# Patient Record
Sex: Male | Born: 1935 | Race: White | Hispanic: No | Marital: Married | Smoking: Former smoker
Health system: Southern US, Community
[De-identification: ages and names within clinical notes are randomized; demographics above are authoritative.]

## PROBLEM LIST (undated history)

## (undated) DIAGNOSIS — H3530 Unspecified macular degeneration: Secondary | ICD-10-CM

## (undated) DIAGNOSIS — G40909 Epilepsy, unspecified, not intractable, without status epilepticus: Secondary | ICD-10-CM

## (undated) DIAGNOSIS — J189 Pneumonia, unspecified organism: Principal | ICD-10-CM

## (undated) DIAGNOSIS — M51369 Other intervertebral disc degeneration, lumbar region without mention of lumbar back pain or lower extremity pain: Secondary | ICD-10-CM

## (undated) DIAGNOSIS — I619 Nontraumatic intracerebral hemorrhage, unspecified: Secondary | ICD-10-CM

## (undated) DIAGNOSIS — M5136 Other intervertebral disc degeneration, lumbar region: Secondary | ICD-10-CM

## (undated) DIAGNOSIS — I10 Essential (primary) hypertension: Secondary | ICD-10-CM

## (undated) DIAGNOSIS — I4891 Unspecified atrial fibrillation: Secondary | ICD-10-CM

## (undated) DIAGNOSIS — M1990 Unspecified osteoarthritis, unspecified site: Secondary | ICD-10-CM

## (undated) DIAGNOSIS — D649 Anemia, unspecified: Secondary | ICD-10-CM

## (undated) HISTORY — PX: CORONARY ANGIOPLASTY: SHX604

## (undated) HISTORY — PX: TONSILLECTOMY: SUR1361

## (undated) HISTORY — DX: Unspecified osteoarthritis, unspecified site: M19.90

## (undated) HISTORY — PX: HERNIA REPAIR: SHX51

## (undated) HISTORY — PX: COLON SURGERY: SHX602

## (undated) HISTORY — DX: Anemia, unspecified: D64.9

## (undated) HISTORY — DX: Other intervertebral disc degeneration, lumbar region without mention of lumbar back pain or lower extremity pain: M51.369

## (undated) HISTORY — PX: APPENDECTOMY: SHX54

## (undated) HISTORY — DX: Essential (primary) hypertension: I10

## (undated) HISTORY — DX: Pneumonia, unspecified organism: J18.9

## (undated) HISTORY — PX: PACEMAKER INSERTION: SHX728

## (undated) HISTORY — DX: Unspecified atrial fibrillation: I48.91

## (undated) HISTORY — DX: Other intervertebral disc degeneration, lumbar region: M51.36

## (undated) HISTORY — DX: Nontraumatic intracerebral hemorrhage, unspecified: I61.9

## (undated) HISTORY — PX: OTHER SURGICAL HISTORY: SHX169

## (undated) HISTORY — DX: Unspecified macular degeneration: H35.30

## (undated) HISTORY — DX: Epilepsy, unspecified, not intractable, without status epilepticus: G40.909

---

## 2002-10-07 HISTORY — PX: OTHER SURGICAL HISTORY: SHX169

## 2003-12-06 ENCOUNTER — Ambulatory Visit: Payer: Self-pay | Admitting: Cardiology

## 2003-12-10 ENCOUNTER — Ambulatory Visit: Payer: Self-pay | Admitting: Family Medicine

## 2003-12-16 ENCOUNTER — Ambulatory Visit: Payer: Self-pay | Admitting: Family Medicine

## 2004-01-07 ENCOUNTER — Ambulatory Visit: Payer: Self-pay | Admitting: Family Medicine

## 2004-01-18 ENCOUNTER — Ambulatory Visit: Payer: Self-pay | Admitting: Internal Medicine

## 2004-02-04 ENCOUNTER — Ambulatory Visit: Payer: Self-pay | Admitting: Family Medicine

## 2004-02-26 ENCOUNTER — Ambulatory Visit: Payer: Self-pay | Admitting: Internal Medicine

## 2004-03-03 ENCOUNTER — Ambulatory Visit: Payer: Self-pay | Admitting: Family Medicine

## 2004-03-08 ENCOUNTER — Ambulatory Visit: Payer: Self-pay | Admitting: Family Medicine

## 2004-03-28 ENCOUNTER — Ambulatory Visit: Payer: Self-pay | Admitting: Internal Medicine

## 2004-03-31 ENCOUNTER — Ambulatory Visit: Payer: Self-pay | Admitting: Family Medicine

## 2004-04-21 ENCOUNTER — Ambulatory Visit: Payer: Self-pay | Admitting: Internal Medicine

## 2004-05-01 ENCOUNTER — Ambulatory Visit: Payer: Self-pay | Admitting: Family Medicine

## 2004-05-09 ENCOUNTER — Ambulatory Visit: Payer: Self-pay | Admitting: Family Medicine

## 2004-05-24 ENCOUNTER — Ambulatory Visit: Payer: Self-pay | Admitting: Internal Medicine

## 2004-06-01 ENCOUNTER — Ambulatory Visit: Payer: Self-pay | Admitting: Family Medicine

## 2004-06-22 ENCOUNTER — Ambulatory Visit: Payer: Self-pay | Admitting: Family Medicine

## 2004-06-29 ENCOUNTER — Ambulatory Visit: Payer: Self-pay | Admitting: Family Medicine

## 2004-07-06 ENCOUNTER — Ambulatory Visit: Payer: Self-pay | Admitting: Internal Medicine

## 2004-07-27 ENCOUNTER — Ambulatory Visit: Payer: Self-pay | Admitting: Family Medicine

## 2004-08-10 ENCOUNTER — Ambulatory Visit: Payer: Self-pay | Admitting: Internal Medicine

## 2004-08-22 ENCOUNTER — Ambulatory Visit: Payer: Self-pay | Admitting: Family Medicine

## 2004-08-25 ENCOUNTER — Ambulatory Visit: Payer: Self-pay | Admitting: Family Medicine

## 2004-08-29 ENCOUNTER — Ambulatory Visit: Payer: Self-pay | Admitting: Family Medicine

## 2004-09-21 ENCOUNTER — Ambulatory Visit: Payer: Self-pay | Admitting: Internal Medicine

## 2004-09-22 ENCOUNTER — Ambulatory Visit: Payer: Self-pay | Admitting: Family Medicine

## 2004-10-20 ENCOUNTER — Ambulatory Visit: Payer: Self-pay | Admitting: Family Medicine

## 2004-10-23 ENCOUNTER — Ambulatory Visit: Payer: Self-pay | Admitting: Family Medicine

## 2004-10-30 ENCOUNTER — Ambulatory Visit: Payer: Self-pay | Admitting: Internal Medicine

## 2004-11-14 ENCOUNTER — Ambulatory Visit: Payer: Self-pay | Admitting: Family Medicine

## 2004-11-20 ENCOUNTER — Ambulatory Visit: Payer: Self-pay | Admitting: Family Medicine

## 2004-11-28 ENCOUNTER — Ambulatory Visit: Payer: Self-pay | Admitting: Internal Medicine

## 2004-12-06 ENCOUNTER — Ambulatory Visit: Payer: Self-pay | Admitting: Family Medicine

## 2004-12-18 ENCOUNTER — Ambulatory Visit: Payer: Self-pay | Admitting: Family Medicine

## 2005-01-02 ENCOUNTER — Ambulatory Visit: Payer: Self-pay | Admitting: Internal Medicine

## 2005-01-15 ENCOUNTER — Ambulatory Visit: Payer: Self-pay | Admitting: Family Medicine

## 2005-02-02 ENCOUNTER — Ambulatory Visit: Payer: Self-pay | Admitting: Internal Medicine

## 2005-02-12 ENCOUNTER — Ambulatory Visit: Payer: Self-pay | Admitting: Family Medicine

## 2005-03-08 ENCOUNTER — Ambulatory Visit: Payer: Self-pay | Admitting: Internal Medicine

## 2005-03-12 ENCOUNTER — Ambulatory Visit: Payer: Self-pay | Admitting: Family Medicine

## 2005-04-09 ENCOUNTER — Ambulatory Visit: Payer: Self-pay | Admitting: Internal Medicine

## 2005-04-16 ENCOUNTER — Ambulatory Visit: Payer: Self-pay | Admitting: Family Medicine

## 2005-04-30 ENCOUNTER — Ambulatory Visit: Payer: Self-pay | Admitting: Family Medicine

## 2005-05-10 ENCOUNTER — Ambulatory Visit: Payer: Self-pay | Admitting: Internal Medicine

## 2005-05-28 ENCOUNTER — Ambulatory Visit: Payer: Self-pay | Admitting: Family Medicine

## 2005-06-20 ENCOUNTER — Ambulatory Visit: Payer: Self-pay | Admitting: Internal Medicine

## 2005-06-28 ENCOUNTER — Ambulatory Visit: Payer: Self-pay | Admitting: Family Medicine

## 2005-07-03 ENCOUNTER — Ambulatory Visit: Payer: Self-pay | Admitting: Family Medicine

## 2005-07-05 ENCOUNTER — Ambulatory Visit: Payer: Self-pay | Admitting: Internal Medicine

## 2005-07-12 ENCOUNTER — Ambulatory Visit: Payer: Self-pay

## 2005-07-19 ENCOUNTER — Encounter: Admission: RE | Admit: 2005-07-19 | Discharge: 2005-07-19 | Payer: Self-pay | Admitting: Cardiology

## 2005-07-26 ENCOUNTER — Inpatient Hospital Stay (HOSPITAL_BASED_OUTPATIENT_CLINIC_OR_DEPARTMENT_OTHER): Admission: RE | Admit: 2005-07-26 | Discharge: 2005-07-26 | Payer: Self-pay | Admitting: Cardiology

## 2005-08-02 ENCOUNTER — Ambulatory Visit: Payer: Self-pay | Admitting: Family Medicine

## 2005-08-07 ENCOUNTER — Ambulatory Visit: Payer: Self-pay | Admitting: Family Medicine

## 2005-08-15 ENCOUNTER — Ambulatory Visit: Payer: Self-pay | Admitting: Family Medicine

## 2005-08-29 ENCOUNTER — Ambulatory Visit: Payer: Self-pay | Admitting: Family Medicine

## 2005-09-26 ENCOUNTER — Ambulatory Visit: Payer: Self-pay | Admitting: Family Medicine

## 2005-10-30 ENCOUNTER — Ambulatory Visit: Payer: Self-pay | Admitting: Family Medicine

## 2005-11-06 ENCOUNTER — Ambulatory Visit: Payer: Self-pay | Admitting: Family Medicine

## 2005-11-07 ENCOUNTER — Ambulatory Visit: Payer: Self-pay | Admitting: Family Medicine

## 2005-11-19 ENCOUNTER — Ambulatory Visit: Payer: Self-pay | Admitting: Gastroenterology

## 2005-11-28 ENCOUNTER — Ambulatory Visit: Payer: Self-pay | Admitting: Family Medicine

## 2005-12-06 HISTORY — PX: COLONOSCOPY: SHX174

## 2005-12-25 ENCOUNTER — Ambulatory Visit: Payer: Self-pay | Admitting: Gastroenterology

## 2005-12-25 ENCOUNTER — Encounter: Payer: Self-pay | Admitting: Gastroenterology

## 2005-12-26 ENCOUNTER — Ambulatory Visit: Payer: Self-pay | Admitting: Family Medicine

## 2006-01-02 ENCOUNTER — Ambulatory Visit: Payer: Self-pay | Admitting: Family Medicine

## 2006-01-10 ENCOUNTER — Ambulatory Visit: Payer: Self-pay | Admitting: Family Medicine

## 2006-02-07 ENCOUNTER — Ambulatory Visit: Payer: Self-pay | Admitting: Family Medicine

## 2006-03-06 ENCOUNTER — Ambulatory Visit: Payer: Self-pay | Admitting: Family Medicine

## 2006-03-13 ENCOUNTER — Ambulatory Visit: Payer: Self-pay | Admitting: Family Medicine

## 2006-03-18 ENCOUNTER — Ambulatory Visit: Payer: Self-pay | Admitting: Family Medicine

## 2006-03-19 ENCOUNTER — Encounter: Payer: Self-pay | Admitting: Family Medicine

## 2006-03-20 ENCOUNTER — Ambulatory Visit: Payer: Self-pay | Admitting: Family Medicine

## 2006-04-10 ENCOUNTER — Ambulatory Visit: Payer: Self-pay | Admitting: Family Medicine

## 2006-05-06 ENCOUNTER — Ambulatory Visit: Payer: Self-pay | Admitting: Family Medicine

## 2006-05-06 LAB — CONVERTED CEMR LAB
AST: 25 units/L (ref 0–37)
Albumin: 3.8 g/dL (ref 3.5–5.2)
BUN: 12 mg/dL (ref 6–23)
Basophils Absolute: 0 10*3/uL (ref 0.0–0.1)
Basophils Relative: 0.6 % (ref 0.0–1.0)
Calcium: 9.2 mg/dL (ref 8.4–10.5)
Chloride: 106 meq/L (ref 96–112)
Cholesterol: 116 mg/dL (ref 0–200)
Creatinine, Ser: 0.8 mg/dL (ref 0.4–1.5)
GFR calc Af Amer: 120 mL/min
GFR calc non Af Amer: 99 mL/min
HDL: 46.7 mg/dL (ref >39.0)
Hemoglobin: 11.7 g/dL — ABNORMAL LOW (ref 13.0–17.0)
Hgb A1c MFr Bld: 6.1 % — ABNORMAL HIGH (ref 4.6–6.0)
LDL Cholesterol: 58 mg/dL (ref 0–99)
MCHC: 32.8 g/dL (ref 30.0–36.0)
Monocytes Relative: 11.9 % — ABNORMAL HIGH (ref 3.0–11.0)
PSA: 3.42 ng/mL (ref 0.10–4.00)
Platelets: 120 10*3/uL — ABNORMAL LOW (ref 150–400)
RBC: 4.09 M/uL — ABNORMAL LOW (ref 4.22–5.81)
RDW: 16 % — ABNORMAL HIGH (ref 11.5–14.6)
Sodium: 143 meq/L (ref 135–145)
Total CHOL/HDL Ratio: 2.5
Triglycerides: 57 mg/dL (ref 0–149)
VLDL: 11 mg/dL (ref 0–40)

## 2006-05-29 ENCOUNTER — Ambulatory Visit: Payer: Self-pay | Admitting: Family Medicine

## 2006-06-03 ENCOUNTER — Ambulatory Visit: Payer: Self-pay | Admitting: Family Medicine

## 2006-06-03 LAB — CONVERTED CEMR LAB: Prothrombin Time: 17.7 s

## 2006-06-28 ENCOUNTER — Telehealth (INDEPENDENT_AMBULATORY_CARE_PROVIDER_SITE_OTHER): Payer: Self-pay | Admitting: *Deleted

## 2006-07-02 ENCOUNTER — Ambulatory Visit: Payer: Self-pay | Admitting: Family Medicine

## 2006-07-02 LAB — CONVERTED CEMR LAB: Prothrombin Time: 21 s

## 2006-08-01 ENCOUNTER — Ambulatory Visit: Payer: Self-pay | Admitting: Family Medicine

## 2006-08-01 DIAGNOSIS — N400 Enlarged prostate without lower urinary tract symptoms: Secondary | ICD-10-CM | POA: Insufficient documentation

## 2006-08-05 LAB — CONVERTED CEMR LAB: Hgb A1c MFr Bld: 6.5 % — ABNORMAL HIGH (ref 4.6–6.0)

## 2006-08-29 ENCOUNTER — Ambulatory Visit: Payer: Self-pay | Admitting: Family Medicine

## 2006-08-29 LAB — CONVERTED CEMR LAB: INR: 2.5

## 2006-09-26 ENCOUNTER — Ambulatory Visit: Payer: Self-pay | Admitting: Family Medicine

## 2006-09-26 LAB — CONVERTED CEMR LAB: Prothrombin Time: 18.2 s

## 2006-10-22 ENCOUNTER — Ambulatory Visit: Payer: Self-pay | Admitting: Family Medicine

## 2006-10-25 ENCOUNTER — Ambulatory Visit: Payer: Self-pay | Admitting: Family Medicine

## 2006-10-25 DIAGNOSIS — D649 Anemia, unspecified: Secondary | ICD-10-CM

## 2006-10-25 DIAGNOSIS — E7800 Pure hypercholesterolemia, unspecified: Secondary | ICD-10-CM | POA: Insufficient documentation

## 2006-10-25 LAB — CONVERTED CEMR LAB: INR: 1.9

## 2006-10-29 LAB — CONVERTED CEMR LAB
ALT: 18 units/L (ref 0–53)
Hgb A1c MFr Bld: 6.3 % — ABNORMAL HIGH (ref 4.6–6.0)
LDL Cholesterol: 72 mg/dL (ref 0–99)
PSA: 3.6 ng/mL (ref 0.10–4.00)
Total CHOL/HDL Ratio: 2.8
VLDL: 9 mg/dL (ref 0–40)

## 2006-11-01 ENCOUNTER — Encounter: Admission: RE | Admit: 2006-11-01 | Discharge: 2006-11-01 | Payer: Self-pay | Admitting: Cardiology

## 2006-11-22 ENCOUNTER — Ambulatory Visit: Payer: Self-pay | Admitting: Family Medicine

## 2006-12-20 ENCOUNTER — Ambulatory Visit: Payer: Self-pay | Admitting: Family Medicine

## 2006-12-20 LAB — CONVERTED CEMR LAB: INR: 1.7

## 2007-01-03 ENCOUNTER — Ambulatory Visit: Payer: Self-pay | Admitting: Family Medicine

## 2007-01-03 LAB — CONVERTED CEMR LAB
INR: 2.8
Prothrombin Time: 20.2 s

## 2007-01-31 ENCOUNTER — Ambulatory Visit: Payer: Self-pay | Admitting: Family Medicine

## 2007-01-31 LAB — CONVERTED CEMR LAB
INR: 2.6
Prothrombin Time: 19.7 s

## 2007-02-11 ENCOUNTER — Encounter: Payer: Self-pay | Admitting: Family Medicine

## 2007-02-11 DIAGNOSIS — I10 Essential (primary) hypertension: Secondary | ICD-10-CM | POA: Insufficient documentation

## 2007-02-11 DIAGNOSIS — E119 Type 2 diabetes mellitus without complications: Secondary | ICD-10-CM

## 2007-02-11 DIAGNOSIS — M1990 Unspecified osteoarthritis, unspecified site: Secondary | ICD-10-CM | POA: Insufficient documentation

## 2007-02-11 DIAGNOSIS — I4891 Unspecified atrial fibrillation: Secondary | ICD-10-CM | POA: Insufficient documentation

## 2007-02-12 ENCOUNTER — Ambulatory Visit: Payer: Self-pay | Admitting: Family Medicine

## 2007-02-28 ENCOUNTER — Ambulatory Visit: Payer: Self-pay | Admitting: Family Medicine

## 2007-03-28 ENCOUNTER — Telehealth: Payer: Self-pay | Admitting: Family Medicine

## 2007-03-28 ENCOUNTER — Ambulatory Visit: Payer: Self-pay | Admitting: Family Medicine

## 2007-03-28 LAB — CONVERTED CEMR LAB: Blood Glucose, Fasting: 20.8 mg/dL

## 2007-04-25 ENCOUNTER — Ambulatory Visit: Payer: Self-pay | Admitting: Family Medicine

## 2007-04-25 LAB — CONVERTED CEMR LAB
INR: 2.9
Prothrombin Time: 20.6 s

## 2007-05-01 ENCOUNTER — Encounter: Payer: Self-pay | Admitting: Family Medicine

## 2007-05-09 ENCOUNTER — Telehealth: Payer: Self-pay | Admitting: Family Medicine

## 2007-05-23 ENCOUNTER — Ambulatory Visit: Payer: Self-pay | Admitting: Family Medicine

## 2007-05-26 LAB — CONVERTED CEMR LAB: Prothrombin Time: 25.3 s — ABNORMAL HIGH (ref 10.9–13.3)

## 2007-06-20 ENCOUNTER — Telehealth (INDEPENDENT_AMBULATORY_CARE_PROVIDER_SITE_OTHER): Payer: Self-pay | Admitting: *Deleted

## 2007-06-20 ENCOUNTER — Ambulatory Visit: Payer: Self-pay | Admitting: Family Medicine

## 2007-07-21 ENCOUNTER — Ambulatory Visit: Payer: Self-pay | Admitting: Family Medicine

## 2007-07-22 LAB — CONVERTED CEMR LAB: INR: 2.7 — ABNORMAL HIGH (ref 0.8–1.0)

## 2007-08-21 ENCOUNTER — Ambulatory Visit: Payer: Self-pay | Admitting: Family Medicine

## 2007-08-21 LAB — CONVERTED CEMR LAB
INR: 2.4
Prothrombin Time: 18.7 s

## 2007-09-16 ENCOUNTER — Ambulatory Visit: Payer: Self-pay | Admitting: Family Medicine

## 2007-10-01 ENCOUNTER — Ambulatory Visit: Payer: Self-pay | Admitting: Family Medicine

## 2007-10-03 ENCOUNTER — Ambulatory Visit: Payer: Self-pay | Admitting: Cardiology

## 2007-10-07 ENCOUNTER — Encounter: Payer: Self-pay | Admitting: Family Medicine

## 2007-10-07 HISTORY — PX: OTHER SURGICAL HISTORY: SHX169

## 2007-10-09 ENCOUNTER — Ambulatory Visit: Payer: Self-pay | Admitting: Specialist

## 2007-10-10 ENCOUNTER — Ambulatory Visit: Payer: Self-pay | Admitting: Family Medicine

## 2007-10-10 LAB — CONVERTED CEMR LAB
OCCULT 1: NEGATIVE
OCCULT 2: NEGATIVE
OCCULT 3: NEGATIVE

## 2007-10-15 ENCOUNTER — Ambulatory Visit: Payer: Self-pay | Admitting: Family Medicine

## 2007-10-15 LAB — CONVERTED CEMR LAB
INR: 2.6
Prothrombin Time: 19.7 s

## 2007-11-11 ENCOUNTER — Ambulatory Visit (HOSPITAL_COMMUNITY): Admission: RE | Admit: 2007-11-11 | Discharge: 2007-11-11 | Payer: Self-pay | Admitting: *Deleted

## 2007-12-10 ENCOUNTER — Ambulatory Visit: Payer: Self-pay | Admitting: Family Medicine

## 2007-12-10 LAB — CONVERTED CEMR LAB: Prothrombin Time: 13.8 s

## 2007-12-18 ENCOUNTER — Ambulatory Visit: Payer: Self-pay | Admitting: Family Medicine

## 2007-12-18 LAB — CONVERTED CEMR LAB: INR: 2.7

## 2007-12-19 LAB — CONVERTED CEMR LAB
Eosinophils Absolute: 0.1 10*3/uL (ref 0.0–0.7)
HCT: 39.5 % (ref 39.0–52.0)
Hemoglobin: 13.3 g/dL (ref 13.0–17.0)
MCHC: 33.7 g/dL (ref 30.0–36.0)
MCV: 89.4 fL (ref 78.0–100.0)
Monocytes Absolute: 0.6 10*3/uL (ref 0.1–1.0)
Monocytes Relative: 11.6 % (ref 3.0–12.0)
Neutro Abs: 2.9 10*3/uL (ref 1.4–7.7)
PSA: 3.95 ng/mL (ref 0.10–4.00)
Platelets: 98 10*3/uL — ABNORMAL LOW (ref 150–400)
RDW: 16.5 % — ABNORMAL HIGH (ref 11.5–14.6)

## 2008-01-15 ENCOUNTER — Ambulatory Visit: Payer: Self-pay | Admitting: Family Medicine

## 2008-02-12 ENCOUNTER — Ambulatory Visit: Payer: Self-pay | Admitting: Family Medicine

## 2008-02-12 LAB — CONVERTED CEMR LAB
INR: 3.5
Prothrombin Time: 22.6 s

## 2008-02-26 ENCOUNTER — Ambulatory Visit: Payer: Self-pay | Admitting: Family Medicine

## 2008-02-26 LAB — CONVERTED CEMR LAB
INR: 2.4
Prothrombin Time: 18.8 s

## 2008-03-25 ENCOUNTER — Ambulatory Visit: Payer: Self-pay | Admitting: Family Medicine

## 2008-03-25 LAB — CONVERTED CEMR LAB
INR: 2.4
Prothrombin Time: 18.8 s

## 2008-04-14 ENCOUNTER — Telehealth: Payer: Self-pay | Admitting: Family Medicine

## 2008-04-22 ENCOUNTER — Ambulatory Visit: Payer: Self-pay | Admitting: Family Medicine

## 2008-05-25 ENCOUNTER — Telehealth: Payer: Self-pay | Admitting: Family Medicine

## 2008-06-01 ENCOUNTER — Ambulatory Visit: Payer: Self-pay | Admitting: Family Medicine

## 2008-06-01 LAB — CONVERTED CEMR LAB: Prothrombin Time: 17.6 s

## 2008-06-17 ENCOUNTER — Ambulatory Visit: Payer: Self-pay | Admitting: Family Medicine

## 2008-06-17 LAB — CONVERTED CEMR LAB: INR: 2.8

## 2008-07-15 ENCOUNTER — Ambulatory Visit: Payer: Self-pay | Admitting: Family Medicine

## 2008-07-15 LAB — CONVERTED CEMR LAB
INR: 2.2
Prothrombin Time: 18.1 s

## 2008-08-12 ENCOUNTER — Ambulatory Visit: Payer: Self-pay | Admitting: Family Medicine

## 2008-08-12 LAB — CONVERTED CEMR LAB: INR: 2

## 2008-09-09 ENCOUNTER — Ambulatory Visit: Payer: Self-pay | Admitting: Family Medicine

## 2008-09-09 LAB — CONVERTED CEMR LAB: Prothrombin Time: 17.9 s

## 2008-09-14 ENCOUNTER — Telehealth: Payer: Self-pay | Admitting: Family Medicine

## 2008-09-14 ENCOUNTER — Encounter: Payer: Self-pay | Admitting: Family Medicine

## 2008-10-14 ENCOUNTER — Ambulatory Visit: Payer: Self-pay | Admitting: Family Medicine

## 2008-10-14 LAB — CONVERTED CEMR LAB: INR: 1.7

## 2008-10-21 ENCOUNTER — Ambulatory Visit: Payer: Self-pay | Admitting: Family Medicine

## 2008-10-29 ENCOUNTER — Ambulatory Visit: Payer: Self-pay | Admitting: Family Medicine

## 2008-10-29 ENCOUNTER — Encounter: Admission: RE | Admit: 2008-10-29 | Discharge: 2008-10-29 | Payer: Self-pay | Admitting: Cardiology

## 2008-10-29 LAB — CONVERTED CEMR LAB
INR: 2.2
Prothrombin Time: 18.2 s

## 2008-11-26 ENCOUNTER — Ambulatory Visit: Payer: Self-pay | Admitting: Family Medicine

## 2008-12-24 ENCOUNTER — Ambulatory Visit: Payer: Self-pay | Admitting: Family Medicine

## 2008-12-27 LAB — CONVERTED CEMR LAB: Prothrombin Time: 24.3 s — ABNORMAL HIGH (ref 9.1–11.7)

## 2009-01-21 ENCOUNTER — Ambulatory Visit: Payer: Self-pay | Admitting: Family Medicine

## 2009-01-21 LAB — CONVERTED CEMR LAB: Prothrombin Time: 16.2 s

## 2009-01-27 ENCOUNTER — Ambulatory Visit: Payer: Self-pay | Admitting: Family Medicine

## 2009-01-27 LAB — CONVERTED CEMR LAB: Prothrombin Time: 20.2 s

## 2009-02-05 DIAGNOSIS — I619 Nontraumatic intracerebral hemorrhage, unspecified: Secondary | ICD-10-CM

## 2009-02-05 HISTORY — DX: Nontraumatic intracerebral hemorrhage, unspecified: I61.9

## 2009-02-24 ENCOUNTER — Ambulatory Visit: Payer: Self-pay | Admitting: Family Medicine

## 2009-02-24 LAB — CONVERTED CEMR LAB
INR: 2.6
Prothrombin Time: 19.5 s

## 2009-03-31 ENCOUNTER — Ambulatory Visit: Payer: Self-pay | Admitting: Family Medicine

## 2009-04-04 LAB — CONVERTED CEMR LAB
Basophils Relative: 0.4 % (ref 0.0–3.0)
CO2: 33 meq/L — ABNORMAL HIGH (ref 19–32)
Calcium: 9.4 mg/dL (ref 8.4–10.5)
Chloride: 107 meq/L (ref 96–112)
Creatinine,U: 70.3 mg/dL
Eosinophils Relative: 4.2 % (ref 0.0–5.0)
HCT: 42.2 % (ref 39.0–52.0)
HDL: 46.4 mg/dL (ref >39.00)
Hemoglobin: 13.7 g/dL (ref 13.0–17.0)
Lymphs Abs: 1.6 10*3/uL (ref 0.7–4.0)
MCV: 98.5 fL (ref 78.0–100.0)
Microalb, Ur: 0.7 mg/dL (ref 0.0–1.9)
Monocytes Absolute: 0.5 10*3/uL (ref 0.1–1.0)
Potassium: 4.8 meq/L (ref 3.5–5.1)
RBC: 4.29 M/uL (ref 4.22–5.81)
Sodium: 143 meq/L (ref 135–145)
WBC: 4.9 10*3/uL (ref 4.5–10.5)

## 2009-04-28 ENCOUNTER — Ambulatory Visit: Payer: Self-pay | Admitting: Family Medicine

## 2009-04-28 LAB — CONVERTED CEMR LAB: INR: 3.1

## 2009-05-26 ENCOUNTER — Ambulatory Visit: Payer: Self-pay | Admitting: Family Medicine

## 2009-05-26 LAB — CONVERTED CEMR LAB: Prothrombin Time: 35.7 s

## 2009-06-13 ENCOUNTER — Ambulatory Visit: Payer: Self-pay | Admitting: Family Medicine

## 2009-06-13 DIAGNOSIS — D696 Thrombocytopenia, unspecified: Secondary | ICD-10-CM

## 2009-06-14 LAB — CONVERTED CEMR LAB
Basophils Absolute: 0 10*3/uL (ref 0.0–0.1)
Eosinophils Absolute: 0.2 10*3/uL (ref 0.0–0.7)
Hemoglobin: 13 g/dL (ref 13.0–17.0)
Lymphocytes Relative: 29.2 % (ref 12.0–46.0)
MCHC: 34.1 g/dL (ref 30.0–36.0)
MCV: 95 fL (ref 78.0–100.0)
Monocytes Absolute: 0.8 10*3/uL (ref 0.1–1.0)
Neutro Abs: 3.2 10*3/uL (ref 1.4–7.7)
RDW: 14 % (ref 11.5–14.6)

## 2009-06-23 ENCOUNTER — Ambulatory Visit: Payer: Self-pay | Admitting: Family Medicine

## 2009-06-23 LAB — CONVERTED CEMR LAB
INR: 2.5
Prothrombin Time: 30.3 s

## 2009-07-20 ENCOUNTER — Ambulatory Visit: Payer: Self-pay | Admitting: Family Medicine

## 2009-07-20 LAB — CONVERTED CEMR LAB
INR: 2.3
Prothrombin Time: 28.1 s

## 2009-08-17 ENCOUNTER — Telehealth: Payer: Self-pay | Admitting: Family Medicine

## 2009-08-17 ENCOUNTER — Ambulatory Visit: Payer: Self-pay | Admitting: Family Medicine

## 2009-08-17 LAB — CONVERTED CEMR LAB
INR: 2.2
Prothrombin Time: 26.4 s

## 2009-08-26 ENCOUNTER — Ambulatory Visit: Payer: Self-pay | Admitting: Family Medicine

## 2009-08-27 ENCOUNTER — Inpatient Hospital Stay (HOSPITAL_COMMUNITY): Admission: EM | Admit: 2009-08-27 | Discharge: 2009-08-31 | Payer: Self-pay | Admitting: Pediatrics

## 2009-08-27 ENCOUNTER — Emergency Department: Payer: Self-pay | Admitting: Emergency Medicine

## 2009-08-29 ENCOUNTER — Encounter (INDEPENDENT_AMBULATORY_CARE_PROVIDER_SITE_OTHER): Payer: Self-pay | Admitting: Cardiology

## 2009-08-29 ENCOUNTER — Encounter: Payer: Self-pay | Admitting: Family Medicine

## 2009-08-29 ENCOUNTER — Telehealth: Payer: Self-pay | Admitting: Family Medicine

## 2009-08-31 ENCOUNTER — Encounter: Payer: Self-pay | Admitting: Family Medicine

## 2009-09-05 ENCOUNTER — Encounter: Payer: Self-pay | Admitting: Nurse Practitioner

## 2009-09-09 ENCOUNTER — Emergency Department (HOSPITAL_COMMUNITY): Admission: EM | Admit: 2009-09-09 | Discharge: 2009-09-09 | Payer: Self-pay | Admitting: Emergency Medicine

## 2009-09-09 ENCOUNTER — Telehealth: Payer: Self-pay | Admitting: Family Medicine

## 2009-09-16 ENCOUNTER — Encounter: Payer: Self-pay | Admitting: Family Medicine

## 2009-09-20 ENCOUNTER — Telehealth: Payer: Self-pay | Admitting: Family Medicine

## 2009-10-06 HISTORY — PX: BRAIN SURGERY: SHX531

## 2009-10-11 ENCOUNTER — Ambulatory Visit: Payer: Self-pay | Admitting: Family Medicine

## 2009-10-11 DIAGNOSIS — I619 Nontraumatic intracerebral hemorrhage, unspecified: Secondary | ICD-10-CM

## 2009-11-02 ENCOUNTER — Encounter: Payer: Self-pay | Admitting: Family Medicine

## 2009-11-08 ENCOUNTER — Encounter: Payer: Self-pay | Admitting: Family Medicine

## 2009-11-11 ENCOUNTER — Encounter: Payer: Self-pay | Admitting: Family Medicine

## 2009-11-11 ENCOUNTER — Encounter: Admission: RE | Admit: 2009-11-11 | Discharge: 2009-11-11 | Payer: Self-pay | Admitting: Neurology

## 2009-11-15 ENCOUNTER — Encounter: Payer: Self-pay | Admitting: Family Medicine

## 2009-11-21 ENCOUNTER — Telehealth: Payer: Self-pay | Admitting: Family Medicine

## 2010-01-10 ENCOUNTER — Ambulatory Visit: Payer: Self-pay | Admitting: Family Medicine

## 2010-01-10 LAB — CONVERTED CEMR LAB
AST: 24 units/L (ref 0–37)
CO2: 32 meq/L (ref 19–32)
Cholesterol: 123 mg/dL (ref 0–200)
Creatinine, Ser: 0.9 mg/dL (ref 0.4–1.5)
GFR calc non Af Amer: 85.5 mL/min (ref >60.00)
Glucose, Bld: 128 mg/dL — ABNORMAL HIGH (ref 70–99)
Hgb A1c MFr Bld: 6.9 % — ABNORMAL HIGH (ref 4.6–6.5)
LDL Cholesterol: 69 mg/dL (ref 0–99)
Sodium: 143 meq/L (ref 135–145)
Total CHOL/HDL Ratio: 3

## 2010-01-12 ENCOUNTER — Ambulatory Visit: Payer: Self-pay | Admitting: Family Medicine

## 2010-01-12 LAB — HM DIABETES FOOT EXAM

## 2010-01-24 ENCOUNTER — Encounter: Payer: Self-pay | Admitting: Family Medicine

## 2010-02-26 ENCOUNTER — Encounter: Payer: Self-pay | Admitting: Family Medicine

## 2010-03-05 LAB — CONVERTED CEMR LAB
ALT: 24 units/L (ref 0–53)
AST: 26 units/L (ref 0–37)
Alkaline Phosphatase: 97 units/L (ref 39–117)
BUN: 15 mg/dL (ref 6–23)
BUN: 17 mg/dL (ref 6–23)
Basophils Relative: 0.7 % (ref 0.0–3.0)
Blood Glucose, Fasting: 0.4 mg/dL
CO2: 30 meq/L (ref 19–32)
Calcium: 9.3 mg/dL (ref 8.4–10.5)
Calcium: 9.6 mg/dL (ref 8.4–10.5)
Chloride: 105 meq/L (ref 96–112)
Creatinine, Ser: 0.8 mg/dL (ref 0.4–1.5)
Creatinine, Ser: 0.9 mg/dL (ref 0.4–1.5)
Eosinophils Absolute: 0.1 10*3/uL (ref 0.0–0.7)
Eosinophils Absolute: 0.1 10*3/uL (ref 0.0–0.7)
Eosinophils Relative: 1.9 % (ref 0.0–5.0)
Eosinophils Relative: 2.3 % (ref 0.0–5.0)
Folate: >20 ng/mL
Glucose, Bld: 109 mg/dL — ABNORMAL HIGH (ref 70–99)
HCT: 33.4 % — ABNORMAL LOW (ref 39.0–52.0)
HCT: 39.5 % (ref 39.0–52.0)
Hgb A1c MFr Bld: 6.2 % — ABNORMAL HIGH (ref 4.6–6.0)
Lymphs Abs: 1.8 10*3/uL (ref 0.7–4.0)
MCHC: 34 g/dL (ref 30.0–36.0)
MCV: 87.4 fL (ref 78.0–100.0)
MCV: 95.1 fL (ref 78.0–100.0)
Monocytes Absolute: 0.6 10*3/uL (ref 0.1–1.0)
Monocytes Absolute: 0.6 10*3/uL (ref 0.1–1.0)
Monocytes Relative: 11.8 % (ref 3.0–12.0)
Neutrophils Relative %: 49.1 % (ref 43.0–77.0)
Phosphorus: 4.3 mg/dL (ref 2.3–4.6)
Platelets: 116 10*3/uL — ABNORMAL LOW (ref 150.0–400.0)
Prothrombin Time: 19 s
RBC: 3.82 M/uL — ABNORMAL LOW (ref 4.22–5.81)
RDW: 14.6 % (ref 11.5–14.6)
Sodium: 140 meq/L (ref 135–145)
TSH: 3.32 microintl units/mL (ref 0.35–5.50)
Total Bilirubin: 0.9 mg/dL (ref 0.3–1.2)
Total CHOL/HDL Ratio: 2.7
WBC: 5.2 10*3/uL (ref 4.5–10.5)
WBC: 5.5 10*3/uL (ref 4.5–10.5)

## 2010-03-07 NOTE — Progress Notes (Signed)
Summary: pt vomiting  Phone Note Call from Patient Call back at Home Phone [phone]   Caller: Spouse Call For: Tony Part MD Summary of Call: Pt had stroke a couple of weeks ago and wife is concerned because he vomited twice today.  Should she be doing anything about that.  No other sxs.   Initial call taken by: Lowella Petties CMA,  September 09, 2009 12:13 PM  Follow-up for Phone Call        I actually recommend sending him to ER for that -- could be a sign of brain swelling and may need an urgent scan (especially if no other GI symptoms) Follow-up by: Tony Part MD,  September 09, 2009 1:45 PM  Additional Follow-up for Phone Call Additional follow up Details #1::        Patient's wife notified as instructed by telephone. Tony Camacho said would take him to [facility] ER.Lewanda Rife LPN  September 09, 2009 1:55 PM

## 2010-03-07 NOTE — Progress Notes (Signed)
Summary: discharge summary  Phone Note Outgoing Call   Summary of Call: called to check in on Tony Camacho  Follow-up for Phone Call        spoke to his wife- he is doing incredibly well speech therapy is helping - and is almost back to normal  personality back to normal and no headaches  will f/u in about 2 weeks told her I was glad to hear it he is no longer on coumadin Follow-up by: Judith Part MD,  September 20, 2009 4:26 PM  Additional Follow-up for Phone Call Additional follow up Details #1::        Tony Camacho- could you call for his d/c summary? thanks Additional Follow-up by: Judith Part MD,  September 20, 2009 4:27 PM    Additional Follow-up for Phone Call Additional follow up Details #2::    H&P and discharge summary is on your shelf in the in box.Tony Rife LPN  September 20, 2009 4:46 PM

## 2010-03-07 NOTE — Letter (Signed)
Summary: [facility]  [facility]   Imported By: Lanelle Bal 11/09/2009 11:15:25  _____________________________________________________________________  External Attachment:    Type:   Image     Comment:   External Document

## 2010-03-07 NOTE — Progress Notes (Signed)
Summary: Referred for CT cancelled..Pt had stroke.Marland Kitchen  Phone Note Call from Patient   Summary of Call: FYI to Dr. Milinda Antis Wife called [place] CT dept. Pt had a Stroke over the weekend.  CT of head cancelled.Daine Gip  August 29, 2009 9:27 AM  Initial call taken by: Daine Gip,  August 29, 2009 9:27 AM  Follow-up for Phone Call        thanks - I am aware  spoke to her today- pt had hemorrhagic stroke - and lost speech abilities briefly on sat is in icu at [facility] being watched closely  is holding coumadin and under care of cardiol and neurol overall she thinks he is back to nl today-- but still watching him carefully I thanked her for the update Follow-up by: Judith Part MD,  August 29, 2009 9:40 AM     Appended Document: Referred for CT cancelled..Pt had stroke.Artelia Laroche- he was transferred to [facility]- can you send for hosp records when able -? - thanks  Appended Document: Referred for CT cancelled..Pt had stroke.. On your shelf in the in box is what we have so far, H&P, labs and CT.

## 2010-03-07 NOTE — Progress Notes (Signed)
Summary: regardign ct   Phone Note Call from Patient Call back at Home Phone [phone]   Caller: Spouse- Tony Camacho Call For: Tony Part MD Summary of Call: Patient had CT done on 11-11-09. Wife is asking if you could review the notes and giver her a call with the results at your convenience. Initial call taken by: Melody Comas,  November 21, 2009 3:05 PM  Follow-up for Phone Call        it looks like CT shows resolving hematoma -- that is re- assuring  she should check in with neurology (since they ordered it ) to see if any further instructions Follow-up by: Tony Part MD,  November 21, 2009 3:08 PM  Additional Follow-up for Phone Call Additional follow up Details #1::        Patient's wife notified.  Additional Follow-up by: Melody Comas,  November 21, 2009 3:41 PM

## 2010-03-07 NOTE — Letter (Signed)
Summary: Tony Camacho -Intercerebral Hemorrhage  Tony Camacho -Intercerebral Hemorrhage   Imported By: Maryln Gottron 09/23/2009 13:17:29  _____________________________________________________________________  External Attachment:    Type:   Image     Comment:   External Document

## 2010-03-07 NOTE — Assessment & Plan Note (Signed)
Summary: test memory/rbh   Vital Signs:  Patient profile:   77 year old male Height:      66.5 inches Weight:      160 pounds BMI:     25.53 Temp:     98 degrees F oral Pulse rate:   76 / minute Pulse rhythm:   regular BP sitting:   116 / 64  (left arm) Cuff size:   regular  Vitals Entered By: Lewanda Rife LPN (August 26, 2009 11:31 AM) CC: test memory   History of Present Illness: here for f/u of some memory problems and changes in behavior  wife sent a list which included loosing things/ leaving water running/ agressive drving and accidents   nothing new in how he is feeling   2 weeks ago - had a headache that lasted 5 days - in the back of his head -- was mild but persistant  then behavior and memory issues got worse  he is not noticing it  cannot do crossword puzzling  pt himself thinks he is fine  no real mood change - except more passive and less motivated   he did stop driving -- since tuesday -- came home from airport way too fast  would speed up without realizing  no more headache  his wife seemed to think he had some slurring - this is better  no nausea  wife is worried about stroke      Allergies: 1)  ! Pcn 2)  ! * Avandia 3)  ! Vioxx 4)  ! * Lisinopril 5)  ! 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  Several sibs with DM, 2 sisters with colitis (chron's disease)  Social History: Last updated: 12/18/2007 Marital Status: Married Children: 3 daughters Occupation: retired non smoker  very rarely drinks alcohol   Risk Factors: Smoking Status: quit (02/11/2007)  Review of Systems General:  Denies fatigue, fever, loss of appetite, malaise, and sleep disorder. Eyes:  Denies blurring and double vision. CV:  Denies chest pain or discomfort, palpitations, shortness of breath with exertion, and swelling of feet. Resp:  Denies cough, shortness of breath, and wheezing. GI:  Denies abdominal pain, change in bowel habits, indigestion, nausea, and vomiting. GU:  Denies dysuria, hematuria, and urinary frequency. MS:  Denies joint pain and joint swelling. Derm:  Denies poor wound healing and rash. Neuro:  Complains of difficulty with concentration, headaches, and memory loss; denies brief paralysis, disturbances in coordination, falling down, numbness, poor balance, sensation of room spinning, tingling, tremors, visual disturbances, and weakness. Psych:  Complains of irritability; denies anxiety, depression, panic attacks, sense of great danger, and suicidal thoughts/plans. Endo:  Denies cold intolerance, excessive thirst, excessive urination, and heat intolerance. Heme:  Denies abnormal bruising and bleeding.  Physical Exam  General:  Well-developed,well-nourished,in no acute distress; alert,appropriate and cooperative throughout examination Head:  normocephalic, atraumatic, and  no abnormalities observed.  no temporal or sinus tenderness  no facial droop noted  Eyes:  vision grossly intact, pupils equal, pupils round, and pupils reactive to light.  fundi grossly wnl  Ears:  R ear normal and L ear normal.   Nose:  no nasal discharge.   Mouth:  pharynx pink and moist.   Neck:  supple with full rom and no masses  or thyromegally, no JVD or carotid bruit  Chest Wall:  No deformities, masses, tenderness or gynecomastia noted. Lungs:  Normal respiratory effort, chest expands symmetrically. Lungs are clear to auscultation, no crackles or wheezes. Heart:  reg rhythm today- no M or gallops Abdomen:  Bowel sounds positive,abdomen soft and non-tender without masses, organomegaly or hernias noted. no renal bruits  Msk:  No deformity or scoliosis noted of thoracic or lumbar spine.   Pulses:  R and L carotid,radial,femoral,dorsalis pedis and posterior tibial pulses are full and equal bilaterally Extremities:  No clubbing, cyanosis, edema, or deformity noted with normal full range of motion of all joints.   Neurologic:  cranial nerves II-XII intact, strength normal in all extremities, sensation intact to light touch, gait normal, DTRs symmetrical and normal, finger-to-nose normal, heel-to-shin normal, toes down bilaterally on Babinski, and Romberg negative.   Skin:  Intact without suspicious lesions or rashes Cervical Nodes:  No lymphadenopathy noted Inguinal Nodes:  No significant adenopathy Psych:  normal affect, talkative and pleasant  when questioned -pt absolutely does not realize anything is wrong Additional Exam:  MMS exam today  good clock draw score 19.5 out of 30    Diabetes Management Exam:    Foot Exam (with socks and/or shoes not present):       Sensory-Pinprick/Light touch:          Left medial foot (L-4): normal          Left dorsal foot (L-5): normal          Left lateral foot (S-1): normal          Right medial foot (L-4): normal          Right dorsal foot (L-5): normal          Right lateral foot (S-1): normal       Sensory-Monofilament:          Left foot: normal          Right foot: normal       Inspection:          Left foot: normal          Right foot: normal       Nails:          Left foot: normal          Right foot: normal   Impression & Recommendations:  Problem # 1:   HEADACHE (ICD-784.0) recent 5 day ha now resolved - with some MS changes since check CT (pt has pacemaker) likely ref to neurol His updated medication list for this problem includes:    Atenolol 50 Mg Tabs (Atenolol) .Marland Kitchen... 1/2  tab by mouth once daily    Adult Aspirin Low Strength 81 Mg Tbdp (Aspirin) .Marland Kitchen... Take by mouth as directed  Orders: Radiology Referral (Radiology)  Problem # 2:  MEMORY LOSS (ICD-780.93) with MMS of 19.5 out of 30 today pt does not recognize problem short term memory and concentration seem affected Orders: Venipuncture (16109) TLB-B12 + Folate Pnl (60454_09811-B14/NWG) TLB-CBC Platelet - w/Differential (85025-CBCD) TLB-TSH (Thyroid Stimulating Hormone) (84443-TSH) TLB-Sedimentation  Rate (ESR) (85652-ESR) TLB-BMP (Basic Metabolic Panel-BMET) (80048-METABOL) Radiology Referral (Radiology)  Complete Medication List: 1)  Flomax 0.4 Mg Cp24 (Tamsulosin hcl) .Marland Kitchen.. 1 by mouth once daily 2)  Atenolol 50 Mg Tabs (Atenolol) .... 1/2  tab by mouth once daily 3)  Metformin Hcl 500 Mg Tabs (Metformin hcl) .... Take 1 tablet by mouth two times a day 4)  Warfarin Sodium 4 Mg Tabs (Warfarin sodium) .... Take 1 tablet by mouth once a day 5)  Ocuvite Extra Tabs (Multiple vitamins-minerals) .... As directed 6)  Glucosamine 1500 Complex Caps (Glucosamine-chondroit-vit c-mn) .... 2 every day 7)  Bactroban 2 % Oint (Mupirocin) .... Apply to affected area twice a day as needed 8)  Adult Aspirin Low Strength 81 Mg Tbdp (Aspirin) .... Take by mouth as directed 9)  Multivitamins Tabs (Multiple vitamin) .... Daily 10)  Vitamin D 1000 Unit Tabs (Cholecalciferol) .... Take two tabs by mouth daily 11)  Lipitor 10 Mg Tabs (Atorvastatin calcium) .... Take one tablet by mouth  daily 12)  Warfarin Sodium 5 Mg Tabs (Warfarin sodium) .... Take 1 tablet by mouth once a day 13)  Cvs Iron 45 Mg Tabs (Iron) .... Take 1 tablet by mouth once a day 14)  Vitamin B-1 250 Mg Tabs (Thiamine hcl) ....  Take 1 tablet by mouth once a day  Patient Instructions: 1)  we will schedule CT at check out  2)  labs today 3)  will make plan based on these results   Current Allergies (reviewed today): ! PCN ! * AVANDIA ! VIOXX ! * LISINOPRIL ! [facility]

## 2010-03-07 NOTE — Letter (Signed)
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  amyloid angiopathy with cerebral hemorrhage in 2011 cataracts  macular degeneration    neuro- Dr Pearlean Brownie   (12/18/2009 21:24) Added new observation of DMEYEEXAMNXT: 11/2010 (12/18/2009 21:24) Added new observation of DMEYEEXMRES: normal (11/15/2009 21:25) Added new observation of EYE EXAM BY: Dr Marlyne Beards (11/15/2009 21:25) Added new observation of DIAB EYE EX: normal (11/15/2009 21:25)       Past Medical History:    Atrial fibrillation  (stopped coumadin due to cerebral hem)    Diabetes mellitus, type II    Hypertension (intol to ace)    Anemia-NOS    Osteoarthritis    Aks    deg disc dz- in LS     amyloid angiopathy with cerebral hemorrhage in 2011    cataracts     macular degeneration                neuro- Dr Pearlean Brownie       Diabetes Management Exam:    Eye Exam:       Eye Exam done elsewhere          Date: 11/15/2009          Results: normal          Done by: Dr Marlyne Beards

## 2010-03-07 NOTE — Assessment & Plan Note (Signed)
Summary: RENEW MEDS   Vital Signs:  Patient profile:   77 year old male Height:      66.5 inches Weight:      163 pounds BMI:     26.01 Temp:     97.5 degrees F oral Pulse rate:   76 / minute Pulse rhythm:   regular BP sitting:   122 / 68  (left arm) Cuff size:   regular  Vitals Entered By: Lewanda Rife LPN (March 31, 2009 8:05 AM)  History of Present Illness: here to renew meds- f/u for HTN and DM and chol is taking good care of himself  feels good overall  nothing new , and no new medicines   no change in med  wt is up 6 lb this winter  diet and exercise are good as always  [facility] and at home   due for labs   DM- has been well contolled  has not tested it lately  last opthy -- was 8 months ago -- - at [facility] and no retinopathy   occ low back pain       Allergies: 1)  ! Pcn 2)  ! * Avandia 3)  ! Vioxx 4)  ! * Lisinopril  Past History:  Past Surgical History: Last updated: 10/21/2007 Left ear surgery Hernia as a child Bowel obstruction- surgery Pace maker Tonsillectomy Appendectomy Carotid screen- mild stenosis, neg AAA screen, ABI's normal (10/2002) 2D Echo- mild LVF and dilated  EF 35-45% (12/2003) PUD with bleeding, per pt (1990's) Cardiolite- abn (07/2005) Cath- CAD (08/2005) Colonoscopy-polyp, tics (12/2005) 9/09 Ct brain-- small subdural hygromas - no acute change  Family History: Last updated: 12/18/2007 Father: DM Mother: DM, stomach cancer Siblings: 5 brothers, 3 sisters.  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 Eyes:  Denies blurring and eye irritation. CV:  Denies chest pain or discomfort, lightheadness, palpitations, and shortness of breath with exertion. Resp:  Denies cough and wheezing. GI:  Denies abdominal pain, bloody stools, and change in bowel habits. GU:  Complains of nocturia; denies incontinence. MS:  Complains of low back pain; denies joint pain and cramps. Derm:  Denies itching, lesion(s), poor wound healing, and rash. Neuro:  Denies numbness and tingling. Psych:  Denies anxiety and depression. Endo:  Denies cold intolerance, excessive thirst, excessive urination, and heat intolerance. Heme:  Denies abnormal bruising and bleeding.  Physical Exam  General:  Well-developed,well-nourished,in no acute distress; alert,appropriate and cooperative throughout examination Head:  normocephalic, atraumatic, and no abnormalities observed.   Eyes:  vision grossly intact, pupils equal, pupils round, and pupils reactive to light.  no conjunctival pallor, injection or icterus  Mouth:  pharynx pink and moist.   Neck:  supple with full rom and no masses or thyromegally, no JVD or carotid bruit  Chest Wall:  No deformities, masses, tenderness or gynecomastia noted. Lungs:  Normal respiratory effort, chest expands symmetrically. Lungs are clear to auscultation, no crackles or wheezes. Heart:  reg rhythm today- no M or gallops Abdomen:  Bowel sounds positive,abdomen soft and non-tender without masses,  organomegaly or hernias noted. no renal bruits  Msk:  No deformity or scoliosis noted of thoracic or lumbar spine.   Pulses:  R and L carotid,radial,femoral,dorsalis pedis and posterior tibial pulses are full and equal bilaterally Extremities:  No clubbing, cyanosis, edema, or deformity noted with normal full range of motion of all joints.   Neurologic:  sensation intact to light touch, gait normal, and DTRs symmetrical  and normal.   Skin:  Intact without suspicious lesions or rashes some dry skin on feet / heels  Cervical Nodes:  No lymphadenopathy noted Inguinal Nodes:  No significant adenopathy Psych:  normal affect, talkative and pleasant   Diabetes Management Exam:    Foot Exam (with socks and/or shoes not present):       Sensory-Pinprick/Light touch:          Left medial foot (L-4): normal          Left dorsal foot (L-5): normal          Left lateral foot (S-1): normal          Right medial foot (L-4): normal          Right dorsal foot (L-5): normal          Right lateral foot (S-1): normal       Sensory-Monofilament:          Left foot: normal          Right foot: normal       Inspection:          Left foot: normal          Right foot: normal       Nails:          Left foot: normal          Right foot: thickened    Eye Exam:       Eye Exam done elsewhere          Date: 07/06/2008          Results: normal          Done by: [facility]   Impression & Recommendations:  Problem # 1:  HYPERTENSION (ICD-401.9) Assessment Unchanged  bp remains in good control with low dose atenolol and exercise unable to tolerate ace due to hypotension lab today f/u 6 mo  His updated medication list for this problem includes:    Atenolol 50 Mg Tabs (Atenolol) .Marland Kitchen... 1/2  tab by mouth once daily  Orders: Venipuncture (54098) TLB-Lipid Panel (80061-LIPID) TLB-Renal Function Panel (80069-RENAL) TLB-CBC Platelet - w/Differential (85025-CBCD) TLB-ALT (SGPT) (84460-ALT) TLB-AST (SGOT) (84450-SGOT) TLB-TSH (Thyroid Stimulating Hormone) (84443-TSH) TLB-A1C / Hgb A1C (Glycohemoglobin) (83036-A1C) TLB-Microalbumin/Creat Ratio, Urine (82043-MALB)  BP today: 122/68 Prior BP: 120/68 (12/18/2007)  Labs Reviewed: K+: 5.1 (09/16/2007) Creat: : 0.8 (09/16/2007)   Chol: 113 (09/16/2007)   HDL: 42.4 (09/16/2007)   LDL: 57 (09/16/2007)   TG: 68 (09/16/2007)  Problem # 2:  DIABETES MELLITUS, TYPE II  (ICD-250.00) Assessment: Unchanged has been well controlled with diet and exercise and metformin- no change enc to check glucose occasionally and stick to low sugar diet  lab today and update  fu 6 mo  opthy is up to date and foot care good  intol to ace  His updated medication list for this problem includes:    Metformin Hcl 500 Mg Tabs (Metformin hcl) .Marland Kitchen... Take 1 tablet by mouth two times a day    Adult Aspirin Low Strength 81 Mg Tbdp (Aspirin) .Marland Kitchen... Take by  mouth as directed  Orders: Venipuncture (60454) TLB-Lipid Panel (80061-LIPID) TLB-Renal Function Panel (80069-RENAL) TLB-CBC Platelet - w/Differential (85025-CBCD) TLB-ALT (SGPT) (84460-ALT) TLB-AST (SGOT) (84450-SGOT) TLB-TSH (Thyroid Stimulating Hormone) (84443-TSH) TLB-A1C / Hgb A1C (Glycohemoglobin) (83036-A1C) TLB-Microalbumin/Creat Ratio, Urine (82043-MALB)  Problem # 3:  HYPERCHOLESTEROLEMIA, PURE (ICD-272.0) Assessment: Unchanged  lipids have been very well controlled  with statin and diet  lab today and update rev low sat fat diet  f/u 6 mo  The following medications were removed from the medication list:    Lipitor 20 Mg Tabs (Atorvastatin calcium) .Marland Kitchen... 1 by mouth once daily/2 His updated medication list for this problem includes:    Lipitor 10 Mg Tabs (Atorvastatin calcium) .Marland Kitchen... Take one tablet by mouth  daily  Orders: Venipuncture (09811) TLB-Lipid Panel (80061-LIPID) TLB-Renal Function Panel (80069-RENAL) TLB-CBC Platelet - w/Differential (85025-CBCD) TLB-ALT (SGPT) (84460-ALT) TLB-AST (SGOT) (84450-SGOT) TLB-TSH (Thyroid Stimulating Hormone) (84443-TSH) TLB-A1C / Hgb A1C (Glycohemoglobin) (83036-A1C) TLB-Microalbumin/Creat Ratio, Urine (82043-MALB)  Labs Reviewed: SGOT: 26 (09/16/2007)   SGPT: 24 (09/16/2007)   HDL:42.4 (09/16/2007), 46.0 (10/25/2006)  LDL:57 (09/16/2007), 72 (10/25/2006)  Chol:113 (09/16/2007), 127 (10/25/2006)  Trig:68 (09/16/2007), 43 (10/25/2006)  Problem # 4:  ANEMIA NOS  (ICD-285.9) Assessment: Unchanged will continue to monitor mild iron def without GI source cbc today Orders: Venipuncture (91478) TLB-Lipid Panel (80061-LIPID) TLB-Renal Function Panel (80069-RENAL) TLB-CBC Platelet - w/Differential (85025-CBCD) TLB-ALT (SGPT) (84460-ALT) TLB-AST (SGOT) (84450-SGOT) TLB-TSH (Thyroid Stimulating Hormone) (84443-TSH) TLB-A1C / Hgb A1C (Glycohemoglobin) (83036-A1C) TLB-Microalbumin/Creat Ratio, Urine (82043-MALB)  Problem # 5:  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 1 by mouth once daily 2)  Atenolol 50 Mg Tabs (Atenolol) .... 1/2  tab by mouth once daily 3)  Metformin Hcl 500 Mg Tabs (Metformin hcl) .... Take 1 tablet by mouth two times a day 4)  Warfarin Sodium 4 Mg Tabs (Warfarin sodium) .... Take 1 tablet by mouth once a day 5)  Ocuvite Extra Tabs (Multiple vitamins-minerals) .... As directed 6)  Glucosamine 1500 Complex Caps (Glucosamine-chondroit-vit c-mn) .... 2 every day 7)  Bactroban 2 % Oint (Mupirocin) .... Apply to affected area twice a day as needed 8)  Adult Aspirin Low Strength 81 Mg Tbdp (Aspirin) .... Take by mouth as directed 9)  Multivitamins Tabs (Multiple vitamin) .... Daily 10)  Vitamin D 1000 Unit Tabs (Cholecalciferol) .... Daily 11)  Lipitor 10 Mg Tabs (Atorvastatin calcium) .... Take one tablet by mouth  daily  Patient Instructions: 1)  no changes in medicines  2)  keep working on diet and exercise  3)  labs today 4)  follow up with me in about 6 months  Prescriptions: LIPITOR 10 MG TABS (ATORVASTATIN CALCIUM) take one tablet by mouth  daily  #90 x 3   Entered and Authorized by:   Judith Part MD   Signed by:   Judith Part MD on 03/31/2009   Method used:   Print then Give to Patient   RxID:   [id number] METFORMIN HCL 500 MG   TABS (METFORMIN HCL) Take 1 tablet by mouth two times a day  #180 x 3   Entered and Authorized by:   Judith Part MD   Signed by:   Judith Part MD on 03/31/2009   Method used:   Print then Give to Patient   RxID:   [id number] ATENOLOL 50 MG  TABS (ATENOLOL) 1/2  tab by mouth once daily  #45 x 3   Entered and Authorized by:   Judith Part MD   Signed by:   Judith Part MD on 03/31/2009   Method used:   Print then Give to Patient   RxID:   [id number] FLOMAX 0.4 MG CP24 (TAMSULOSIN HCL) 1 by mouth once daily  #90 x 3   Entered and Authorized by:   Judith Part MD   Signed by:   Judith Part MD on 03/31/2009   Method used:   Print then Give to Patient   RxID:   [id number]   Current Allergies (reviewed today): ! PCN ! * AVANDIA ! VIOXX ! * LISINOPRIL   Influenza Immunization History:    Influenza # 1:  Fluvax 3+ (01/05/2009)    Preventive Care Screening  Contraindications of Treatment or Deferment of Test/Procedure:    Treatment: ACE-Inhibitor    Contraindication: hypotension  Appended Document: RENEW MEDS  Laboratory Results   Blood Tests   Date/Time Recieved: March 31, 2009 10:16 AM  Date/Time Reported: March 31, 2009 10:16 AM    INR: 2.6   (Normal Range: 0.88-1.12   Therap INR: 2.0-3.5)      ANTICOAGULATION RECORD PREVIOUS REGIMEN & LAB RESULTS Anticoagulation Diagnosis:  Atrial fibrillation on  02/24/2009 Previous INR Goal Range:  2.0-3.5 on  01/21/2009 Previous INR:  2.6 on  02/24/2009 Previous Coumadin Dose(mg):  5 mg daily on  02/24/2009 Previous Regimen:  5 mg daily on  02/24/2009 Previous Coagulation Comments:  . on  12/18/2007  NEW REGIMEN & LAB RESULTS Current INR: 2.6 Regimen: 5 mg daily  (no change)  Provider: Porchea Charrier      Repeat testing in: 4 weeks MEDICATIONS FLOMAX 0.4 MG CP24 (TAMSULOSIN HCL) 1 by mouth once daily ATENOLOL 50 MG  TABS (ATENOLOL) 1/2  tab by mouth once daily METFORMIN HCL 500 MG   TABS (METFORMIN HCL) Take 1 tablet by mouth two times a day WARFARIN SODIUM 4 MG  TABS (WARFARIN SODIUM) Take 1 tablet by mouth once a day OCUVITE EXTRA   TABS (MULTIPLE VITAMINS-MINERALS) as directed GLUCOSAMINE 1500 COMPLEX   CAPS (GLUCOSAMINE-CHONDROIT-VIT C-MN) 2 every day BACTROBAN 2 %  OINT (MUPIROCIN) apply to affected area twice a day as needed ADULT ASPIRIN LOW STRENGTH 81 MG  TBDP (ASPIRIN) take by mouth as directed MULTIVITAMINS   TABS (MULTIPLE VITAMIN) daily VITAMIN D 1000 UNIT  TABS (CHOLECALCIFEROL) daily LIPITOR 10 MG TABS (ATORVASTATIN CALCIUM) take one tablet by mouth  daily   Anticoagulation Visit Questionnaire      Coumadin dose missed/changed:  No      Abnormal Bleeding Symptoms:  No Any diet changes including alcohol intake, vegetables or greens since the last visit:  No Any illnesses or hospitalizations since the last visit:  No Any signs of clotting since the last visit (including chest discomfort, dizziness, shortness of breath, arm tingling, slurred speech, swelling or redness in leg):  No

## 2010-03-07 NOTE — Letter (Signed)
Summary: Note from Patient Wife with Concerns  Note from Patient Wife with Concerns   Imported By: Lanelle Bal 08/23/2009 12:33:21  _____________________________________________________________________  External Attachment:    Type:   Image     Comment:   External Document

## 2010-03-07 NOTE — Letter (Signed)
Summary: Cardiology-Dr. Viann Fish  Cardiology-Dr. Viann Fish   Imported By: Maryln Gottron 09/22/2009 15:34:56  _____________________________________________________________________  External Attachment:    Type:   Image     Comment:   External Document

## 2010-03-07 NOTE — Progress Notes (Signed)
Summary: notes from pt's wife  Phone Note Call from Patient Call back at Home Phone [phone]   Caller: Spouse Summary of Call: Pt's wife has brought in some notes regarding pt's problem with confusion.  Notes are on your shelf. Initial call taken by: Lowella Petties CMA,  August 17, 2009 11:26 AM  Follow-up for Phone Call        thanks - I read them and will address at upcoming appt - please ask her if HE knows that she dropped this off and if it is ok to review some of it with him at the visit  Follow-up by: Judith Part MD,  August 17, 2009 11:34 AM  Additional Follow-up for Phone Call Additional follow up Details #1::        Wife says pt is not aware of the notes but he is aware of wife's concerns about his issues.  She said she will go over the notes with him before he comes in for his appt, so that he will be aware.  thanks that is helpful -- MT  Additional Follow-up by: Lowella Petties CMA,  August 17, 2009 11:47 AM

## 2010-03-07 NOTE — Letter (Signed)
Summary: [facility]  [facility]   Imported By: Beau Fanny 08/29/2009 14:35:55  _____________________________________________________________________  External Attachment:    Type:   Image     Comment:   External Document

## 2010-03-07 NOTE — Assessment & Plan Note (Signed)
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 Pcn 2)  ! * Avandia 3)  ! Vioxx 4)  ! * Lisinopril 5)  ! Tonette Bihari  Past History:  Family History: Last updated: 12/18/2007 Father: DM Mother: DM, stomach cancer Siblings: 5 brothers, 3 sisters.  Several sibs with DM, 2 sisters with colitis (chron's disease)  Social History: Last updated: 12/18/2007 Marital Status: Married Children: 3 daughters Occupation: retired non smoker  very rarely drinks alcohol   Risk Factors: Smoking Status: quit (02/11/2007)  Past Medical History: Atrial fibrillation  (stopped coumadin due to cerebral hem) Diabetes mellitus, type II Hypertension (intol to ace) Anemia-NOS Osteoarthritis Aks deg disc dz- in LS  amyloid angiopathy with cerebral hemorrhage in 2011    neuro- Dr Pearlean Brownie  Past Surgical History: Left ear surgery Hernia as a child Bowel  obstruction- surgery Pace maker Tonsillectomy Appendectomy Carotid screen- mild stenosis, neg AAA screen, ABI's normal (10/2002) 2D Echo- mild LVF and dilated  EF 35-45% (12/2003) PUD with bleeding, per pt (1990's) Cardiolite- abn (07/2005) Cath- CAD (08/2005) Colonoscopy-polyp, tics (12/2005) 9/09 Ct brain-- small subdural hygromas - no acute change 9/11 cerebral hemorrhage from amyloid angiopathy  Review of Systems General:  Denies fatigue, fever, loss of appetite, malaise, and sweats. Eyes:  Denies blurring, vision loss-1 eye, and vision loss-both eyes. ENT:  Denies sinus pressure and sore throat. CV:  Denies chest pain or discomfort and palpitations. Resp:  Denies cough, shortness of breath, and wheezing. GI:  Denies abdominal pain and change in bowel habits. GU:  Denies dysuria and urinary frequency. MS:  Denies muscle aches and cramps. Derm:  Denies itching, lesion(s), and rash. Neuro:  Complains of memory loss; denies difficulty with concentration, disturbances in coordination, headaches, inability to speak, numbness, poor balance, tingling, visual disturbances, and weakness. Psych:  Denies anxiety, depression, irritability, and mental problems. Endo:  Denies cold intolerance, excessive thirst, excessive urination, and heat intolerance. Heme:  Denies abnormal bruising and bleeding.  Physical Exam  General:  Well-developed,well-nourished,in no acute distress; alert,appropriate and cooperative throughout examination very slt occ word finding difficulties  Head:  normocephalic, atraumatic, and no abnormalities  observed.  no facial droop  Eyes:  vision grossly intact, pupils equal, pupils round, and pupils reactive to light.  no nystagmus  Mouth:  pharynx pink and moist.   Neck:  supple with full rom and no masses or thyromegally, no JVD or carotid bruit  Lungs:  Normal respiratory effort, chest expands symmetrically. Lungs are clear to auscultation, no crackles or  wheezes. Heart:  reg rhythm today- no M or gallops Abdomen:  Bowel sounds positive,abdomen soft and non-tender without masses, organomegaly or hernias noted. no renal bruits  Msk:  No deformity or scoliosis noted of thoracic or lumbar spine.   Pulses:  R and L carotid,radial,femoral,dorsalis pedis and posterior tibial pulses are full and equal bilaterally Extremities:  No clubbing, cyanosis, edema, or deformity noted with normal full range of motion of all joints.   Neurologic:  cranial nerves II-XII intact, strength normal in all extremities, sensation intact to light touch, gait normal, DTRs symmetrical and normal, finger-to-nose normal, heel-to-shin normal, toes down bilaterally on Babinski, and Romberg negative.   Skin:  Intact without suspicious lesions or rashes Cervical Nodes:  No lymphadenopathy noted Psych:  normal affect, talkative and pleasant  seems back to his normal self  Diabetes Management Exam:    Foot Exam (with socks and/or shoes not present):       Sensory-Pinprick/Light touch:          Left medial foot (L-4): normal          Left dorsal foot (L-5): normal          Left lateral foot (S-1): normal          Right medial foot (L-4): normal          Right dorsal foot (L-5): normal          Right lateral foot (S-1): normal       Sensory-Monofilament:          Left foot: normal          Right foot: normal       Inspection:          Left foot: normal          Right foot: normal       Nails:          Left foot: normal          Right foot: normal   Impression & Recommendations:  Problem # 1:  CEREBRAL HEMORRHAGE (ICD-431) Assessment New from amyloid angiopathy- now totally recovered clinically after stopping all blood thinners  will see neurol upcoming disc s/s to watch for  The following medications were removed from the medication list:    Warfarin Sodium 4 Mg Tabs (Warfarin sodium) .Marland Kitchen... Take 1 tablet by mouth once a day    Adult Aspirin Low Strength 81 Mg Tbdp  (Aspirin) .Marland Kitchen... Take by mouth as directed    Warfarin Sodium 5 Mg Tabs (Warfarin sodium) .Marland Kitchen... Take 1 tablet by mouth once a day  Problem # 2:  MEMORY LOSS (ICD-780.93) Assessment: Improved this is resolved with resolution of above  Problem # 3:  HYPERTENSION (ICD-401.9) Assessment: Unchanged  His updated medication list for this problem includes:    Atenolol 50 Mg Tabs (Atenolol) .Marland Kitchen... 1/2  tab by mouth once daily  His updated medication list for this problem includes:    Atenolol 50 Mg Tabs (Atenolol) .Marland Kitchen... 1/2  tab by mouth once daily  BP today: 128/68 Prior BP: 116/64 (08/26/2009)  Labs Reviewed: K+: 4.5 (  08/26/2009) Creat: : 0.9 (08/26/2009)   Chol: 117 (03/31/2009)   HDL: 46.40 (03/31/2009)   LDL: 57 (03/31/2009)   TG: 66.0 (03/31/2009)  Problem # 4:  ATRIAL FIBRILLATION (ICD-427.31) Assessment: Unchanged no longer on anticoag- being followed by Dr Donnie Aho The following medications were removed from the medication list:    Warfarin Sodium 4 Mg Tabs (Warfarin sodium) .Marland Kitchen... Take 1 tablet by mouth once a day    Adult Aspirin Low Strength 81 Mg Tbdp (Aspirin) .Marland Kitchen... Take by mouth as directed    Warfarin Sodium 5 Mg Tabs (Warfarin sodium) .Marland Kitchen... Take 1 tablet by mouth once a day His updated medication list for this problem includes:    Atenolol 50 Mg Tabs (Atenolol) .Marland Kitchen... 1/2  tab by mouth once daily  Problem # 5:  DIABETES MELLITUS, TYPE II (ICD-250.00) Assessment: Comment Only  sugar up in hosp from stress- nl now AIC and f/u 3 mo  The following medications were removed from the medication list:    Adult Aspirin Low Strength 81 Mg Tbdp (Aspirin) .Marland Kitchen... Take by mouth as directed His updated medication list for this problem includes:    Metformin Hcl 500 Mg Tabs (Metformin hcl) .Marland Kitchen... Take 1 tablet by mouth two times a day  Labs Reviewed: Creat: 0.9 (08/26/2009)     Last Eye Exam: normal (07/06/2008) Reviewed HgBA1c results: 6.3 (03/31/2009)  6.2 (09/16/2007)  Complete  Medication List: 1)  Flomax 0.4 Mg Cp24 (Tamsulosin hcl) .Marland Kitchen.. 1 by mouth once daily 2)  Atenolol 50 Mg Tabs (Atenolol) .... 1/2  tab by mouth once daily 3)  Metformin Hcl 500 Mg Tabs (Metformin hcl) .... Take 1 tablet by mouth two times a day 4)  Ocuvite Extra Tabs (Multiple vitamins-minerals) .... As directed 5)  Glucosamine 1500 Complex Caps (Glucosamine-chondroit-vit c-mn) .... 2 every day 6)  Multivitamins Tabs (Multiple vitamin) .... Daily 7)  Vitamin D 1000 Unit Tabs (Cholecalciferol) .... Take two tabs by mouth daily 8)  Lipitor 10 Mg Tabs (Atorvastatin calcium) .... Take one tablet by mouth  daily 9)  Cvs Iron 45 Mg Tabs (Iron) .... Take 1 tablet by mouth once a day 10)  Vitamin B-1 250 Mg Tabs (Thiamine hcl) .... Take 1 tablet by mouth once a day  Patient Instructions: 1)  follow up with Dr Pearlean Brownie as planned 2)  no change in medicine  3)  schedule fasting labs and then follow up in 3 months  4)  lipids/ast/alt/ / AIC/ renal 272, 250.0  Current Allergies (reviewed today): ! PCN ! * AVANDIA ! VIOXX ! * LISINOPRIL ! [facility]

## 2010-03-09 NOTE — Assessment & Plan Note (Signed)
Summary: 3 MONTH FOLLOW UP/RBH   Vital Signs:  Patient profile:   77 year old male Height:      66.5 inches Weight:      161.75 pounds BMI:     25.81 Temp:     97.6 degrees F oral Pulse rate:   80 / minute Pulse rhythm:   regular BP sitting:   122 / 76  (left arm) Cuff size:   regular  Vitals Entered By: Lewanda Rife LPN (January 12, 2010 9:30 AM) CC: three month f/u   History of Present Illness: here for f/u of lipids and DM and HTN   is feeling good  CT showed resolving hematoma  has amyloidosis -- and symptoms after hemorrhage are pretty much resolved     wt is up 4 lb  HTN is well controlled at 122/76 no headachea  DM is a bit worse- unusual  Aic is 6.9 up from 6.3 diet- a little holiday eating -- some sweets  is still exercising more than 4 times per week  utd opthy on metformin  chol in good control with statin and diet Last Lipid ProfileCholesterol: 123 (01/10/2010 8:37:54 AM)HDL:  40.90 (01/10/2010 8:37:54 AM)LDL:  69 (01/10/2010 8:37:54 AM)Triglycerides:  Last Liver profileSGOT:  24 (01/10/2010 8:37:54 AM)SPGT:  26 (01/10/2010 8:37:54 AM)T. Bili:  0.9 (09/16/2007 1:10:00 PM)Alk Phos:  97 (09/16/2007 1:10:00 PM)   lipids trig 64 and HDL 40 and LDL 69 -- very well controlled     Allergies: 1)  ! Pcn 2)  ! * Avandia 3)  ! Vioxx 4)  ! * Lisinopril 5)  ! Tonette Bihari  Past History:  Past Medical History: Last updated: 12/18/2009 Atrial fibrillation  (stopped coumadin due to cerebral hem) Diabetes mellitus, type II Hypertension (intol to ace) Anemia-NOS Osteoarthritis Aks deg disc dz- in LS  amyloid angiopathy with cerebral hemorrhage in 2011 cataracts  macular degeneration    neuro- Dr Pearlean Brownie  Past Surgical History: Last updated: 10/11/2009 Left ear surgery Hernia as a child Bowel obstruction- surgery Pace maker Tonsillectomy Appendectomy Carotid screen- mild stenosis, neg AAA screen, ABI's normal (10/2002) 2D Echo- mild LVF and  dilated  EF 35-45% (12/2003) PUD with bleeding, per pt (1990's) Cardiolite- abn (07/2005) Cath- CAD (08/2005) Colonoscopy-polyp, tics (12/2005) 9/09 Ct brain-- small subdural hygromas - no acute change 9/11 cerebral hemorrhage from amyloid angiopathy  Family History: Last updated: 12/18/2007 Father: DM Mother: DM, stomach cancer Siblings: 5 brothers, 3 sisters.  Several sibs with DM, 2 sisters with colitis (chron's disease)  Social History: Last updated: 12/18/2007 Marital Status: Married Children: 3 daughters Occupation: retired non smoker  very rarely drinks alcohol   Risk Factors: Smoking Status: quit (02/11/2007)  Review of Systems General:  Denies fatigue, loss of appetite, and malaise. Eyes:  Denies blurring and eye irritation. CV:  Denies chest pain or discomfort, lightheadness, and palpitations. Resp:  Denies cough and shortness of breath. GI:  Denies abdominal pain, bloody stools, and change in bowel habits. GU:  Denies dysuria and urinary frequency. MS:  Denies muscle aches and cramps. Derm:  Denies itching, lesion(s), poor wound healing, and rash. Neuro:  Denies numbness and tingling. Psych:  Denies anxiety, depression, and mental problems. Endo:  Denies cold intolerance, excessive thirst, excessive urination, and heat intolerance. Heme:  Denies abnormal bruising and bleeding.  Physical Exam  General:  Well-developed,well-nourished,in no acute distress; alert,appropriate and cooperative throughout examination Head:  normocephalic, atraumatic, and no abnormalities observed.   Eyes:  vision grossly intact,  pupils equal, pupils round, and pupils reactive to light.  no conjunctival pallor, injection or icterus  Mouth:  pharynx pink and moist.   Neck:  supple with full rom and no masses or thyromegally, no JVD or carotid bruit  Chest Wall:  No deformities, masses, tenderness or gynecomastia noted. Lungs:  Normal respiratory effort, chest expands symmetrically.  Lungs are clear to auscultation, no crackles or wheezes. Heart:  reg rhythm today- no M or gallops Abdomen:  Bowel sounds positive,abdomen soft and non-tender without masses, organomegaly or hernias noted. no renal bruits  Msk:  No deformity or scoliosis noted of thoracic or lumbar spine.  no acute joint changes  Pulses:  R and L carotid,radial,femoral,dorsalis pedis and posterior tibial pulses are full and equal bilaterally Extremities:  No clubbing, cyanosis, edema, or deformity noted with normal full range of motion of all joints.   Neurologic:  sensation intact to light touch, gait normal, and DTRs symmetrical and normal.   Skin:  Intact without suspicious lesions or rashes Cervical Nodes:  No lymphadenopathy noted Inguinal Nodes:  No significant adenopathy Psych:  normal affect, talkative and pleasant   Diabetes Management Exam:    Foot Exam (with socks and/or shoes not present):       Sensory-Pinprick/Light touch:          Left medial foot (L-4): normal          Left dorsal foot (L-5): normal          Left lateral foot (S-1): normal          Right medial foot (L-4): normal          Right dorsal foot (L-5): normal          Right lateral foot (S-1): normal       Sensory-Monofilament:          Left foot: normal          Right foot: normal       Inspection:          Left foot: normal          Right foot: normal       Nails:          Left foot: normal          Right foot: normal   Impression & Recommendations:  Problem # 1:  CEREBRAL HEMORRHAGE (ICD-431) Assessment Improved doing better and better  imp on CT  off blood thinners for neurol visit soon  Problem # 2:  HYPERTENSION (ICD-401.9) Assessment: Unchanged  bp great on atenolol  no changes  rev labs plan check up in 6 mo His updated medication list for this problem includes:    Atenolol 50 Mg Tabs (Atenolol) .Marland Kitchen... 1/2  tab by mouth once daily  BP today: 122/76 Prior BP: 128/68 (10/11/2009)  Labs  Reviewed: K+: 4.4 (01/10/2010) Creat: : 0.9 (01/10/2010)   Chol: 123 (01/10/2010)   HDL: 40.90 (01/10/2010)   LDL: 69 (01/10/2010)   TG: 64.0 (01/10/2010)  Problem # 3:  DIABETES MELLITUS, TYPE II (ICD-250.00) Assessment: Deteriorated  this is slt worse with holiday eating  will stop sweets  keep up exercise opthy utd foot care good no changes f/u for check up in 6 mo His updated medication list for this problem includes:    Metformin Hcl 500 Mg Tabs (Metformin hcl) .Marland Kitchen... Take 1 tablet by mouth two times a day  Labs Reviewed: Creat: 0.9 (01/10/2010)     Last Eye Exam: normal (11/15/2009)  Reviewed HgBA1c results: 6.9 (01/10/2010)  6.3 (03/31/2009)  Problem # 4:  HYPERCHOLESTEROLEMIA, PURE (ICD-272.0) Assessment: Unchanged  pt wants to change to crestor for cost will try 10 mg -- may be able to dec to 5 dep on result lab and f/u 6 mo update if any side eff His updated medication list for this problem includes:    Crestor 10 Mg Tabs (Rosuvastatin calcium) .Marland Kitchen... 1 by mouth once daily  Labs Reviewed: SGOT: 24 (01/10/2010)   SGPT: 26 (01/10/2010)   HDL:40.90 (01/10/2010), 46.40 (03/31/2009)  LDL:69 (01/10/2010), 57 (03/31/2009)  Chol:123 (01/10/2010), 117 (03/31/2009)  Trig:64.0 (01/10/2010), 66.0 (03/31/2009)  Complete Medication List: 1)  Flomax 0.4 Mg Cp24 (Tamsulosin hcl) .Marland Kitchen.. 1 by mouth once daily 2)  Atenolol 50 Mg Tabs (Atenolol) .... 1/2  tab by mouth once daily 3)  Metformin Hcl 500 Mg Tabs (Metformin hcl) .... Take 1 tablet by mouth two times a day 4)  Ocuvite Extra Tabs (Multiple vitamins-minerals) .... As directed 5)  Glucosamine 1500 Complex Caps (Glucosamine-chondroit-vit c-mn) .... 2 every day 6)  Multivitamins Tabs (Multiple vitamin) .... Daily 7)  Vitamin D 1000 Unit Tabs (Cholecalciferol) .... Take two tabs by mouth daily 8)  Crestor 10 Mg Tabs (Rosuvastatin calcium) .Marland Kitchen.. 1 by mouth once daily 9)  Cvs Iron 45 Mg Tabs (Iron) .... Take 1 tablet by mouth once a  day 10)  Vitamin B-1 250 Mg Tabs (Thiamine hcl) .... Take 1 tablet by mouth once a day 11)  Cerefolin Nac 6-2-600 Mg Tabs (Methylfol-methylcob-acetylcyst) .... Take 1 tablet by mouth once a day 12)  Dexedrine ?mg  .... Take 1 tablet by mouth once a day 13)  Fish Oil 1000 Mg  .Marland KitchenMarland Kitchen. 1 by mouth once daily  Patient Instructions: 1)  labs are good - but cut back on the sweets  2)  no change in medicine  3)  schedule fasting labs and then f/u for 30 min check up in 6 months  4)  lipid / hepatic/renal/ AIC / cbc with diff/ tsh and psa for 250/0 and 401.1 and 272 and prostate screen Prescriptions: CRESTOR 10 MG TABS (ROSUVASTATIN CALCIUM) 1 by mouth once daily  #90 x 3   Entered and Authorized by:   Judith Part MD   Signed by:   Judith Part MD on 01/12/2010   Method used:   Print then Give to Patient   RxID:   [id number] FLOMAX 0.4 MG CP24 (TAMSULOSIN HCL) 1 by mouth once daily  #90 x 3   Entered and Authorized by:   Judith Part MD   Signed by:   Judith Part MD on 01/12/2010   Method used:   Print then Give to Patient   RxID:   [id number] METFORMIN HCL 500 MG  TABS (METFORMIN HCL) Take 1 tablet by mouth two times a day  #180 x 3   Entered and Authorized by:   Judith Part MD   Signed by:   Judith Part MD on 01/12/2010   Method used:   Print then Give to Patient   RxID:   [id number] ATENOLOL 50 MG  TABS (ATENOLOL) 1/2  tab by mouth once daily  #45 x 3   Entered and Authorized by:   Judith Part MD   Signed by:   Judith Part MD on 01/12/2010   Method used:   Print then Give to Patient   RxID:   [id number]    Orders Added: 1)  Est. Patient Level IV [20254]    Current Allergies (reviewed today): ! PCN ! * AVANDIA ! VIOXX ! * LISINOPRIL ! [facility]

## 2010-03-09 NOTE — Progress Notes (Signed)
Summary: Med & Physician List Brought by Patient  Med & Physician List Brought by Patient   Imported By: Lanelle Bal 01/19/2010 12:58:53  _____________________________________________________________________  External Attachment:    Type:   Image     Comment:   External Document

## 2010-03-09 NOTE — Letter (Signed)
Summary: [facility]  [facility]   Imported By: Lanelle Bal 02/07/2010 09:34:44  _____________________________________________________________________  External Attachment:    Type:   Image     Comment:   External Document

## 2010-04-14 ENCOUNTER — Encounter: Payer: Self-pay | Admitting: Family Medicine

## 2010-04-21 LAB — URINALYSIS, ROUTINE W REFLEX MICROSCOPIC
Bilirubin Urine: NEGATIVE
Glucose, UA: 100 mg/dL — AB
Hgb urine dipstick: NEGATIVE
Ketones, ur: 40 mg/dL — AB
Nitrite: NEGATIVE
Protein, ur: NEGATIVE mg/dL
Specific Gravity, Urine: 1.018 (ref 1.005–1.030)
Urobilinogen, UA: 1 mg/dL (ref 0.0–1.0)
pH: 7.5 (ref 5.0–8.0)

## 2010-04-21 LAB — CBC
HCT: 38.4 % — ABNORMAL LOW (ref 39.0–52.0)
Hemoglobin: 13.4 g/dL (ref 13.0–17.0)
WBC: 6.6 10*3/uL (ref 4.0–10.5)

## 2010-04-21 LAB — DIFFERENTIAL
Basophils Absolute: 0 10*3/uL (ref 0.0–0.1)
Basophils Relative: 0 % (ref 0–1)
Eosinophils Absolute: 0 10*3/uL (ref 0.0–0.7)
Monocytes Relative: 2 % — ABNORMAL LOW (ref 3–12)
Neutrophils Relative %: 84 % — ABNORMAL HIGH (ref 43–77)

## 2010-04-21 LAB — COMPREHENSIVE METABOLIC PANEL
ALT: 20 U/L (ref 0–53)
Alkaline Phosphatase: 100 U/L (ref 39–117)
CO2: 27 mEq/L (ref 19–32)
Chloride: 103 mEq/L (ref 96–112)
GFR calc non Af Amer: >60 mL/min (ref >60)
Glucose, Bld: 195 mg/dL — ABNORMAL HIGH (ref 70–99)
Potassium: 3.7 mEq/L (ref 3.5–5.1)
Sodium: 138 mEq/L (ref 135–145)
Total Bilirubin: 1.2 mg/dL (ref 0.3–1.2)
Total Protein: 7.1 g/dL (ref 6.0–8.3)

## 2010-04-22 LAB — CBC
HCT: 36 % — ABNORMAL LOW (ref 39.0–52.0)
Hemoglobin: 12.5 g/dL — ABNORMAL LOW (ref 13.0–17.0)
RBC: 3.78 MIL/uL — ABNORMAL LOW (ref 4.22–5.81)
WBC: 5.7 10*3/uL (ref 4.0–10.5)

## 2010-04-22 LAB — COMPREHENSIVE METABOLIC PANEL
ALT: 19 U/L (ref 0–53)
Alkaline Phosphatase: 70 U/L (ref 39–117)
CO2: 28 mEq/L (ref 19–32)
Chloride: 105 mEq/L (ref 96–112)
GFR calc non Af Amer: >60 mL/min (ref >60)
Glucose, Bld: 107 mg/dL — ABNORMAL HIGH (ref 70–99)
Potassium: 3.6 mEq/L (ref 3.5–5.1)
Sodium: 140 mEq/L (ref 135–145)
Total Bilirubin: 1 mg/dL (ref 0.3–1.2)
Total Protein: 6.3 g/dL (ref 6.0–8.3)

## 2010-04-22 LAB — DIFFERENTIAL
Basophils Absolute: 0 10*3/uL (ref 0.0–0.1)
Basophils Relative: 0 % (ref 0–1)
Eosinophils Absolute: 0.1 10*3/uL (ref 0.0–0.7)
Monocytes Relative: 10 % (ref 3–12)
Neutrophils Relative %: 56 % (ref 43–77)

## 2010-04-22 LAB — GLUCOSE, CAPILLARY
Glucose-Capillary: 101 mg/dL — ABNORMAL HIGH (ref 70–99)
Glucose-Capillary: 104 mg/dL — ABNORMAL HIGH (ref 70–99)
Glucose-Capillary: 111 mg/dL — ABNORMAL HIGH (ref 70–99)
Glucose-Capillary: 155 mg/dL — ABNORMAL HIGH (ref 70–99)
Glucose-Capillary: 157 mg/dL — ABNORMAL HIGH (ref 70–99)
Glucose-Capillary: 169 mg/dL — ABNORMAL HIGH (ref 70–99)

## 2010-04-22 LAB — PROTIME-INR: INR: 1.48 (ref 0.00–1.49)

## 2010-04-25 NOTE — Letter (Signed)
Summary: [facility]   [facility]   Imported By: Kassie Mends 04/19/2010 09:37:17  _____________________________________________________________________  External Attachment:    Type:   Image     Comment:   External Document

## 2010-06-20 NOTE — Op Note (Signed)
NAMEWALT, GEATHERS                 ACCOUNT NO.:  [id number]   MEDICAL RECORD NO.:  [id number]          PATIENT TYPE:  OIB   LOCATION:  2899                         FACILITY:  [facility]   PHYSICIAN:  Elmore Guise., M.D.DATE OF BIRTH:  1924/05/07   DATE OF PROCEDURE:  11/11/2007  DATE OF DISCHARGE:                               OPERATIVE REPORT   PROCEDURE:  Generator change out current generator at elective  replacement interval.   HISTORY OF PRESENT ILLNESS:  Mr. Blaustein is a very pleasant 77 year old  white male who has a history of bradytachy syndrome, now has elective  generator change out.  He is now referred for generator change out.   PROCEDURE DESCRIPTION:  The patient presented to the Cardiac Cath Lab.  After appropriate informed consent, he was prepped and draped in sterile  fashion.  Approximately 20 mL of 1% lidocaine was used for local  anesthesia.  A 2-inch incision was made over the old pacemaker site in  the right deltopectoral groove.  The pacemaker generator was then  exposed with blunt and Bovie dissection.  Hemostasis was obtained with  electrocautery.  The pacemaker was removed from the pocket.  The pocket  was copiously irrigated with kanamycin solution.  The leads were then  checked for integrity.  The ventricular lead showed R waves of 16.4 mV  with impedance of 980 ohms, threshold of 1.0 volts at 0.4 milliseconds  with a current of 4.6 mA.  The atrial lead was then checked showing  atrial fibrillation waves of 0.6 mV with impedance of 460 ohms.  The old  Medtronic Kappa generator was removed.  The new generator Medtronic  ADAPTA L ADDRL-1, serial number [id number] was then placed on the table.  The ventricular lead was placed in the appropriate portion of the header  then the atrial lead was placed.  The pacemaker was then sewn into the  pocket.  The pocket was closed in 2 continuous layers with 2-0 Vicryl  suture.  Steri-Strips were applied.  No further  bleeding was noted.  The  patient was transferred from the Cardiac Cath Lab in stable condition.   IMPRESSION:  Successful generator change out.   PLAN:  The patient should have routine post pacemaker change out  restrictions.  He was given vancomycin prior to his procedure.  He  should to keep his site clean and dry for 1 week.  He is to hold his  Coumadin for the next 48 hours.  He is to call the office if he has any  further problems.      Elmore Guise., M.D.  Electronically Signed     TWK/MEDQ  D:  11/11/2007  T:  11/11/2007  Job:  [id number]   cc:   Georga Hacking, M.D.

## 2010-06-23 NOTE — Assessment & Plan Note (Signed)
[name]                           GASTROENTEROLOGY OFFICE NOTE   NAME:Tony Camacho, Tony Camacho                        MRN:          [id number]  DATE:11/19/2005                            DOB:          03-20-1924    REASON FOR REFERRAL:  Hemoccult positive stool and constipation.   HISTORY OF PRESENT ILLNESS:  Tony Camacho is a very nice 77 year old white  male referred through the courtesy of Dr. Roxy Manns.  He relates problems  with intermittent constipation for the past year.  He has noted occasional  small amounts of dark red blood in his stool over the past month.  He was  found to have Hemoccult positive stools on home hemoccults.  He states he  had a colonoscopy performed in the [place] in [place] by Dr.  Annie Sable in 2000.  He does not recall any significant abnormalities found.  He  has had problems with heartburn and reflux in the past, but these symptoms  are now inactive.  He notes no change in stool caliber, melena, or weight  loss.  He has been maintained on Coumadin anticoagulation for about the past  7-8 years for a history of atrial fibrillation.   PAST MEDICAL HISTORY:  History of bradycardia and chronic atrial  fibrillation status post pacemaker insertion in 2001.  Chronic Coumadin  anticoagulation.  Hypertension.  Diabetes mellitus.  Status post exploratory  laparotomy in the 1980s.  Status post right inguinal hernia repair as a  child.  Status post appendectomy as a child.  Coronary artery disease status  post cardiac catheterization in June of 2007.   CURRENT MEDICATIONS:  Listed on the chart, updated, and reviewed.   MEDICATION ALLERGIES:  PENICILLIN.   SOCIAL HISTORY AND REVIEW OF SYSTEMS:  Per the hand-written form.   PHYSICAL EXAM:  In no acute distress.  Height 5 feet 9 inches.  Weight 159 pounds.  Blood pressure is 132/76, pulse  68 and regular.  HEENT:  Anicteric sclerae.  Oropharynx clear.  NECK:  Without  thyromegaly or adenopathy appreciated.  CHEST:  Clear to auscultation bilaterally.  CARDIAC:  Irregular rhythm without murmurs appreciated.  ABDOMEN:  Soft.  Nontender.  Nondistended.  Normoactive bowel sounds.  No  palpable organomegaly, masses, or hernias.  RECTAL:  Deferred to the time of colonoscopy.  EXTREMITIES:  Without cyanosis, clubbing, or edema.  NEUROLOGIC:  Alert and oriented x3.  Grossly nonfocal.   ASSESSMENT AND PLAN:  Hemoccult positive stools, history of small volume  hematochezia, and worsening constipation.  Chronic Coumadin anticoagulation.  Risks, benefits, and alternatives of colonoscopy with possible biopsy and  possible polypectomy off Coumadin anticoagulation discussed with the patient  and he consents to proceed.  This will be scheduled  electively.  Will obtain clearance from Dr. Viann Fish for a hold of  Coumadin for 5 days prior to the procedure.       Venita Lick. Russella Dar, MD, [facility]      MTS/MedQ  DD:  11/19/2005  DT:  11/19/2005  Job #:  [id number]   cc:   Marne A. Tower,  MD

## 2010-06-23 NOTE — Cardiovascular Report (Signed)
Tony Camacho, Tony Camacho                 ACCOUNT NO.:  [id number]   MEDICAL RECORD NO.:  [id number]          PATIENT TYPE:  OIB   LOCATION:  1962                         FACILITY:  [facility]   PHYSICIAN:  Georga Hacking, M.D.DATE OF BIRTH:  1924-06-27   DATE OF PROCEDURE:  07/26/2005  DATE OF DISCHARGE:                              CARDIAC CATHETERIZATION   HISTORY:  The patient is a 77 year old male with an abnormal Cardiolite  stress test, family history of heart disease and dyspnea.  He has previous  atrial fibrillation as well as a permanent pacemaker.  Previous Cardiolite  showed apical infarction and possible ischemia.  c,.   The patient tolerated the procedure well without complications.  Following  the procedure, he had good hemostasis and normal peripheral pulses noted.   HEMODYNAMIC DATA:  Aorta post contrast 134/64; LV post contrast 134/10.   ANGIOGRAPHIC DATA:  Left ventriculogram:  Performed in the 30 degree RAO  projection, the aortic valve is normal.  The mitral valve is normal.  The  left ventricle appears normal in size.  There is either dyskinesis of the  apex and hypokinesis related to the previous pacemaker or a previous  infarction.   Coronary artery:  Arises and distributes normally.  There is significant  heavy calcification involving the left main and proximal LAD as well as the  right coronary artery.   Left main coronary artery:  This is calcified with what is thought to be  mild narrowing.  Initial views showed streaming along the artery, but  subsequent views showed the left main to be widely patent.   Left anterior descending:  There is a well-developed fistula from the  proximal LAD to the pulmonary artery.  There are scattered irregularities  along the LAD, but there is no significant focal stenosis noted, especially  at the apex.  The circumflex coronary artery has calcification in the mid  portion of the vessel.  There is moderate diffuse irregularity  involving the  circumflex.   Right coronary artery:  Right coronary artery is calcified.  There is  segmental 60% narrowing in the mid portion of the vessel.  There is a 50%  stenosis involving the posterior descending artery, two posterolateral  branches arise and are free of disease.   IMPRESSION:  1.  Coronary artery disease with significant calcification in the coronary      arteries particularly involving the left coronary system as circumflex      and right coronary artery with moderately severe disease involving the      right coronary artery, diffuse disease involving the circumflex and left      anterior descending which is not critical.  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  3.  Abnormal left ventricular function with an ejection fraction of about 40-      45% with apical dyskinesis and hypokinesis.   RECOMMENDATIONS:  Continue medical therapy.      Georga Hacking, M.D.  Electronically Signed     WST/MEDQ  D:  07/26/2005  T:  07/26/2005  Job:  [id number]   cc:   Marne A. Tower, M.D. [facility]  [address] [place]  [place] [address]

## 2010-07-11 ENCOUNTER — Other Ambulatory Visit (INDEPENDENT_AMBULATORY_CARE_PROVIDER_SITE_OTHER): Payer: Medicare Other | Admitting: Family Medicine

## 2010-07-11 DIAGNOSIS — Z125 Encounter for screening for malignant neoplasm of prostate: Secondary | ICD-10-CM

## 2010-07-11 DIAGNOSIS — I10 Essential (primary) hypertension: Secondary | ICD-10-CM

## 2010-07-11 DIAGNOSIS — E7800 Pure hypercholesterolemia, unspecified: Secondary | ICD-10-CM

## 2010-07-11 DIAGNOSIS — E119 Type 2 diabetes mellitus without complications: Secondary | ICD-10-CM

## 2010-07-11 LAB — HEPATIC FUNCTION PANEL
ALT: 26 U/L (ref 0–53)
AST: 22 U/L (ref 0–37)
Bilirubin, Direct: 0.2 mg/dL (ref 0.0–0.3)
Total Protein: 6.8 g/dL (ref 6.0–8.3)

## 2010-07-11 LAB — CBC WITH DIFFERENTIAL/PLATELET
Basophils Absolute: 0 10*3/uL (ref 0.0–0.1)
Eosinophils Absolute: 0.3 10*3/uL (ref 0.0–0.7)
HCT: 41.4 % (ref 39.0–52.0)
Lymphs Abs: 2.2 10*3/uL (ref 0.7–4.0)
MCHC: 34 g/dL (ref 30.0–36.0)
MCV: 98.4 fl (ref 78.0–100.0)
Monocytes Absolute: 0.5 10*3/uL (ref 0.1–1.0)
Neutrophils Relative %: 46.2 % (ref 43.0–77.0)
Platelets: 90 10*3/uL — ABNORMAL LOW (ref 150.0–400.0)
RDW: 13.4 % (ref 11.5–14.6)
WBC: 5.7 10*3/uL (ref 4.5–10.5)

## 2010-07-11 LAB — RENAL FUNCTION PANEL
Albumin: 4.2 g/dL (ref 3.5–5.2)
BUN: 14 mg/dL (ref 6–23)
Calcium: 9.5 mg/dL (ref 8.4–10.5)
Creatinine, Ser: 0.9 mg/dL (ref 0.4–1.5)
Glucose, Bld: 121 mg/dL — ABNORMAL HIGH (ref 70–99)
Phosphorus: 4 mg/dL (ref 2.3–4.6)
Potassium: 4.4 mEq/L (ref 3.5–5.1)

## 2010-07-11 LAB — PSA: PSA: 4.04 ng/mL — ABNORMAL HIGH (ref 0.10–4.00)

## 2010-07-11 LAB — TSH: TSH: 5.36 u[IU]/mL (ref 0.35–5.50)

## 2010-07-11 LAB — LIPID PANEL
Cholesterol: 95 mg/dL (ref 0–200)
VLDL: 11.4 mg/dL (ref 0.0–40.0)

## 2010-07-15 ENCOUNTER — Encounter: Payer: Self-pay | Admitting: Family Medicine

## 2010-07-18 ENCOUNTER — Encounter: Payer: Self-pay | Admitting: Family Medicine

## 2010-07-18 ENCOUNTER — Ambulatory Visit (INDEPENDENT_AMBULATORY_CARE_PROVIDER_SITE_OTHER): Payer: Medicare Other | Admitting: Family Medicine

## 2010-07-18 VITALS — BP 120/60 | HR 84 | Temp 97.7°F | Ht 66.5 in | Wt 161.0 lb

## 2010-07-18 DIAGNOSIS — I4891 Unspecified atrial fibrillation: Secondary | ICD-10-CM

## 2010-07-18 DIAGNOSIS — E119 Type 2 diabetes mellitus without complications: Secondary | ICD-10-CM

## 2010-07-18 DIAGNOSIS — D696 Thrombocytopenia, unspecified: Secondary | ICD-10-CM

## 2010-07-18 DIAGNOSIS — E7800 Pure hypercholesterolemia, unspecified: Secondary | ICD-10-CM

## 2010-07-18 DIAGNOSIS — I10 Essential (primary) hypertension: Secondary | ICD-10-CM

## 2010-07-18 DIAGNOSIS — N400 Enlarged prostate without lower urinary tract symptoms: Secondary | ICD-10-CM

## 2010-07-18 NOTE — Patient Instructions (Signed)
We will do hematology referral at check out for low platelets  Please send for last colonoscopy report (and path of any polyps) - to see when due for next one  Sugar control and cholesterol are good  Schedule non fasting labs and then follow up in 6 months

## 2010-07-18 NOTE — Progress Notes (Signed)
Subjective:    Patient ID: Tony Camacho, male    DOB: 11-11-1924, 77 y.o.   MRN: [id number]  HPI Here for check up of chronic med problems and to rev health mt list  Feels good  No new medical problems  Wt is stable and good   DM is stable with a1c of 6.8- well controlled  No changes at home in sugars  opthy 10/11  On metformin  Active and good diet -- still exercises 5 days per week    Lipids excellent with LDL 39-- on crestor Lab Results  Component Value Date   CHOL 95 07/11/2010   CHOL 123 01/10/2010   CHOL 117 03/31/2009   Lab Results  Component Value Date   HDL 44.20 07/11/2010   HDL 40.90 01/10/2010   HDL 40.98 03/31/2009   Lab Results  Component Value Date   LDLCALC 39 07/11/2010   LDLCALC 69 01/10/2010   LDLCALC 57 03/31/2009   Lab Results  Component Value Date   TRIG 57.0 07/11/2010   TRIG 64.0 01/10/2010   TRIG 66.0 03/31/2009   Lab Results  Component Value Date   CHOLHDL 2 07/11/2010   CHOLHDL 3 01/10/2010   CHOLHDL 3 03/31/2009   No results found for this basename: LDLDIRECT     Hx of anemia and thrombocytopenia Lab Results  Component Value Date   WBC 5.7 07/11/2010   HGB 14.1 07/11/2010   HCT 41.4 07/11/2010   MCV 98.4 07/11/2010   PLT 90.0* 07/11/2010    Platelets are down again  Bleeds easy and bruises easy   Last Tdap-- was within 2 years  ptx 09 Gets annual flu shots   Zoster vacc 06  colonosc 11/07 adenomatous polyp-- ? F/u 5 y? Due in fall ?   psa 4.04 - is usually in high 3s  Does not see a urologist  No more problems with urine flow- is stable Has done well on the flomax -- nocturia usually 2 times per night  Has BPH  Patient Active Problem List  Diagnoses  . DIABETES MELLITUS, TYPE II  . HYPERCHOLESTEROLEMIA, PURE  . ANEMIA NOS  . THROMBOCYTOPENIA  . HYPERTENSION  . ATRIAL FIBRILLATION  . CEREBRAL HEMORRHAGE  . 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 OSTEOARTHRITIS   Past Medical History  Diagnosis Date  . Diabetes mellitus       type II  . Atrial fibrillation     stopped coumadin due to cerebral hem.  . Hypertension     intolerance to ace  . Anemia   . Arthritis     osteoarthritis  . Cataract   . Macular degeneration   . Disc degeneration, lumbar   . Cerebral hemorrhage 2011    amyloid angiopathy with cerebral hemorrhage   Past Surgical History  Procedure Date  . Left ear surgery   . Hernia repair     as a child  . Colon surgery     bowel obstruction surgery  . Appendectomy   . Tonsillectomy   . Coronary angioplasty     CAD//2D ECHO - mild LVF and dilated EF 35-45%(12/2003)//cardiolite abn (07/2005)  . Carotid screen 10/2002    mild stenosis, neg AAA screen, ABI's normal  . Colonoscopy 12/2005    polyp, tics  . Ct brain 10/2007    small subdural hygromas-- no acute change  . 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 Pacemaker insertion    History  Substance  Use Topics  . Smoking status: Former Games developer  . Smokeless tobacco: Not on file  . Alcohol Use: Yes     Rarely   Family History  Problem Relation Age of Onset  . Diabetes Mother   . Cancer Mother     stomach  . Diabetes Father   . Diabetes Sister   . Diabetes Brother    Allergies  Allergen Reactions  . Lisinopril     REACTION: hypotension  . Penicillins     REACTION: ?  . Rofecoxib     REACTION: edema  . Rosiglitazone Maleate     REACTION: swelling   Current Outpatient Prescriptions on File Prior to Visit  Medication Sig Dispense Refill  . atenolol (TENORMIN) 50 MG tablet 1/2 tablet by mouth once daily       . carbonyl iron ([facility] IRON) 45 MG TABS Take 45 mg by mouth daily.        . cholecalciferol (VITAMIN D) 1000 UNITS tablet Take two tablets by mouth daily       . Glucosamine-Chondroit-Vit C-Mn (GLUCOSAMINE 1500 COMPLEX PO) 2 every day by mouth       . metFORMIN (GLUCOPHAGE) 500 MG tablet Take 500 mg by mouth 2 (two) times daily with a meal.        . Methylfol-Methylcob-Acetylcyst  (CEREFOLIN NAC) 6-2-600 MG TABS Take 1 tablet by mouth once a day       . Multiple Vitamin (MULTIVITAMIN PO) Take 1 daily       . Multiple Vitamins-Minerals (OCUVITE EXTRA) TABS As directed       . Omega-3 Fatty Acids (FISH OIL) 1000 MG CAPS Take 1 capsule by mouth 2 (two) times daily.       . rosuvastatin (CRESTOR) 10 MG tablet Take 10 mg by mouth daily.        . Tamsulosin HCl (FLOMAX) 0.4 MG CAPS Take 0.4 mg by mouth daily.        . Thiamine HCl (VITAMIN B-1) 250 MG tablet Take 250 mg by mouth daily.            Review of Systems     Objective:   Physical Exam  Constitutional: He appears well-developed and well-nourished. No distress.  HENT:  Head: Normocephalic and atraumatic.  Right Ear: External ear normal.  Left Ear: External ear normal.  Nose: Nose normal.  Eyes: Conjunctivae and EOM are normal. Pupils are equal, round, and reactive to light.  Neck: Normal range of motion. Neck supple. No JVD present. Carotid bruit is not present. Erythema present. No thyromegaly present.  Cardiovascular: Normal rate, normal heart sounds and intact distal pulses.        Irregularly irregular rhythm  Pulmonary/Chest: Effort normal and breath sounds normal. No respiratory distress. He has no wheezes.  Abdominal: Soft. Bowel sounds are normal. He exhibits no distension, no abdominal bruit and no mass. There is no tenderness.  Genitourinary: Rectum normal. Rectal exam shows no mass and no tenderness. Prostate is enlarged.  Musculoskeletal: Normal range of motion. He exhibits no tenderness.  Lymphadenopathy:    He has no cervical adenopathy.  Neurological: He is alert. He has normal reflexes. No cranial nerve deficit. He exhibits normal muscle tone. Coordination normal.  Skin: Skin is warm and dry. No rash noted. No erythema. No pallor.  Psychiatric: He has a normal mood and affect.          Assessment & Plan:

## 2010-07-20 NOTE — Assessment & Plan Note (Signed)
Stable and well controlled Rev low glycemic diet  Re check 6 mo  opthy utd

## 2010-07-20 NOTE — Assessment & Plan Note (Signed)
On asa Not candidate for coumadin after cerebral bleed Has low platelets Hesitant to stop asa in light of afib- going to heme

## 2010-07-20 NOTE — Assessment & Plan Note (Signed)
Good control on current meds No problems  Urged to remain active

## 2010-07-20 NOTE — Assessment & Plan Note (Signed)
Good control with very low LDL on crestor and diet  Rev labs with pt  Rev low satfat diet

## 2010-07-20 NOTE — Assessment & Plan Note (Signed)
This is worse ? ITP or other ? Rel to his cerebral bleed in past Ref to heme

## 2010-07-20 NOTE — Assessment & Plan Note (Signed)
Relatively stable  Psa is just above 4 On flomax- stable symptoms Continue to monitor

## 2010-08-03 ENCOUNTER — Ambulatory Visit: Payer: Self-pay | Admitting: Oncology

## 2010-08-06 ENCOUNTER — Ambulatory Visit: Payer: Self-pay | Admitting: Oncology

## 2010-08-16 ENCOUNTER — Ambulatory Visit (INDEPENDENT_AMBULATORY_CARE_PROVIDER_SITE_OTHER)
Admission: RE | Admit: 2010-08-16 | Discharge: 2010-08-16 | Disposition: A | Discharge status: Home / Self Care | Payer: Medicare Other | Source: Ambulatory Visit | Attending: Family Medicine | Admitting: Family Medicine

## 2010-08-16 ENCOUNTER — Ambulatory Visit (INDEPENDENT_AMBULATORY_CARE_PROVIDER_SITE_OTHER): Payer: Medicare Other | Admitting: Family Medicine

## 2010-08-16 ENCOUNTER — Encounter: Payer: Self-pay | Admitting: Family Medicine

## 2010-08-16 VITALS — BP 122/76 | HR 64 | Temp 97.7°F | Wt 162.4 lb

## 2010-08-16 DIAGNOSIS — M25561 Pain in right knee: Secondary | ICD-10-CM

## 2010-08-16 DIAGNOSIS — M25569 Pain in unspecified knee: Secondary | ICD-10-CM

## 2010-08-16 DIAGNOSIS — M1710 Unilateral primary osteoarthritis, unspecified knee: Secondary | ICD-10-CM

## 2010-08-16 NOTE — Progress Notes (Signed)
Tony Camacho, a 76 y.o. male presents today in the office for the following:   Painful, both knees are bothering a lot. Now, pain on R, pain on the R side. Could hardly walk. Happened yesterday  Patient presents with 2 day h/o r sided knee pain after no acute injury. No audible pop was heard. The patient has had an effusion. + symptomatic giving-way. Now using a cane. No mechanical clicking. Joint has not locked up. Patient has been able to walk but is limping. The patient does have pain going up and down stairs or rising from a seated position.   Pain location: diffuse Current physical activity: was doing exercise class until this injury Prior Knee Surgery: none Current pain meds: tylenol, icy hot Bracing: knee sleeve  The PMH, PSH, Social History, Family History, Medications, and allergies have been reviewed in [facility], and have been updated if relevant.  ROS: no acute illness or fever MSK: above GI: tol po intake without nausea or vomitting. Neuro: no numbness, tingling, or radiculopathy O/w per hpi  PHYSICAL EXAM  Blood pressure 122/76, pulse 64, temperature 97.7 F (36.5 C), temperature source Oral, weight 162 lb 6.4 oz (73.664 kg).  GEN: Well-developed,well-nourished,in no acute distress; alert,appropriate and cooperative throughout examination HEENT: Normocephalic and atraumatic without obvious abnormalities. Ears, externally no deformities PULM: Breathing comfortably in no respiratory distress EXT: No clubbing, cyanosis, or edema PSYCH: Normally interactive. Cooperative during the interview. Pleasant. Friendly and conversant. Not anxious or depressed appearing. Normal, full affect.  Gait: antalgic with cane ROM: loss of 4 deg extension, flexion to 90 Effusion: moderate Echymosis or edema: none Patellar tendon NT Painful PLICA: neg Patellar grind: negative Medial and lateral patellar facet loading: negative medial and lateral joint lines: B Special meniscal testing  equivocal Varus and valgus stress: stable Lachman: neg Ant and Post drawer: neg Quad: 4/5 VMO atrophy: yes Hamstring concentric and eccentric: 5/5  A/P: 1. Knee Pain: 2. Knee, R, OA:  X-rays: AP Bilateral Weight-bearing, Weightbearing Lateral, Sunrise views Indication: knee pain Findings: severe PF OA, calcification in artery noted. Mild medial OA. Possible small loose body seen on AP.  Reassured. No NSAIDS in this patient.  Knee Aspiration and Injection, R Patient verbally consented; risks, benefits, and alternatives explained. Patient prepped with betadine. Ethyl chloride for anesthesia. 10 cc of 1% Lidocaine used in wheal then injected Subcutaneous fashion with 27 gauge needle on lateral approach. Under sterilne conditions, 18 gauge needle used via lateral approach to aspirate 20 cc of yellowish synovial fluid tinged with blood. Then 9 cc of Lidocaine 1% and 1 cc of Kenalog 40 mg injected. Tolerated well, decreased pain, no complications.

## 2010-09-12 ENCOUNTER — Other Ambulatory Visit: Payer: Self-pay | Admitting: Dermatology

## 2010-10-06 ENCOUNTER — Telehealth: Payer: Self-pay | Admitting: Family Medicine

## 2010-10-06 DIAGNOSIS — B351 Tinea unguium: Secondary | ICD-10-CM

## 2010-10-06 NOTE — Telephone Encounter (Signed)
Triage request for referral to foot doctor is on your shelf in the in box.

## 2010-10-06 NOTE — Telephone Encounter (Signed)
Pt came to the office today, need referral to specialist for a toe nail fungus. In [place]. Triage request in Dr. Milinda Antis Box in [place] office area...[facility]avis

## 2010-10-09 DIAGNOSIS — B351 Tinea unguium: Secondary | ICD-10-CM | POA: Insufficient documentation

## 2010-10-09 NOTE — Telephone Encounter (Signed)
I will go ahead and ref to podiatry for fungal nails

## 2010-11-06 DIAGNOSIS — J189 Pneumonia, unspecified organism: Secondary | ICD-10-CM

## 2010-11-06 HISTORY — DX: Pneumonia, unspecified organism: J18.9

## 2010-11-06 LAB — GLUCOSE, CAPILLARY: Glucose-Capillary: 87

## 2010-12-04 ENCOUNTER — Ambulatory Visit (INDEPENDENT_AMBULATORY_CARE_PROVIDER_SITE_OTHER): Payer: Medicare Other | Admitting: Family Medicine

## 2010-12-04 ENCOUNTER — Ambulatory Visit (INDEPENDENT_AMBULATORY_CARE_PROVIDER_SITE_OTHER)
Admission: RE | Admit: 2010-12-04 | Discharge: 2010-12-04 | Disposition: A | Discharge status: Home / Self Care | Payer: Medicare Other | Source: Ambulatory Visit | Attending: Family Medicine | Admitting: Family Medicine

## 2010-12-04 ENCOUNTER — Encounter: Payer: Self-pay | Admitting: Family Medicine

## 2010-12-04 VITALS — BP 134/80 | HR 84 | Temp 98.9°F | Wt 177.1 lb

## 2010-12-04 DIAGNOSIS — R05 Cough: Secondary | ICD-10-CM

## 2010-12-04 DIAGNOSIS — J189 Pneumonia, unspecified organism: Secondary | ICD-10-CM

## 2010-12-04 MED ORDER — LEVOFLOXACIN 500 MG PO TABS: 500.0000 mg | ORAL_TABLET | Freq: Every day | ORAL | Status: AC

## 2010-12-04 NOTE — Progress Notes (Signed)
  Subjective:    Patient ID: Tony Camacho, male    DOB: 1924-03-04, 77 y.o.   MRN: [id number]  HPI CC: feeling ill  Cold sxs started Saturday.  Heavy mucous, labored breathing, chills.  Also weak in legs and knees for last 2 months .  Fever up to 102.1 on Saturday, treated with tylenol and improved.  No fevers since.  Coughing wet sounding. + nasal congestion.  Has been using sudafed and tylenol.  Also using "nur tropfen" from [place].  Mild SOB and wheezing.  Not sudden onset of illness, progressive.  Difficulty sleeping as well as increase in sleeping during day (as opposed to 1/2 hour routine).  Sleep talking.  Some confusion (possibly due to worsening hearing).  Hearing seems much worse than normal.  No abd pain, n/v/d, chest pain or tightness, HA, dizziness, acute vision changes, Denies one sided arm weakness, facial droop.  No ear pain, tooth pain.  No sick contacts at home.  No smokers at home.  No h/o asthma, COPD.  Had flu shot 11/27/2010.  Worried about this.  Did have hemorrhagic CVA 1 year ago.  Presented with lack of communication, more quiet.  Review of Systems Per HPI    Objective:   Physical Exam  Nursing note and vitals reviewed. Constitutional: He appears well-developed and well-nourished. No distress.  HENT:  Head: Normocephalic and atraumatic.  Right Ear: Tympanic membrane, external ear and ear canal normal.  Left Ear: Tympanic membrane, external ear and ear canal normal.  Nose: No mucosal edema or rhinorrhea. Right sinus exhibits no maxillary sinus tenderness and no frontal sinus tenderness. Left sinus exhibits no maxillary sinus tenderness and no frontal sinus tenderness.  Mouth/Throat: Uvula is midline, oropharynx is clear and moist and mucous membranes are normal. No oropharyngeal exudate, posterior oropharyngeal edema or posterior oropharyngeal erythema.  Eyes: Conjunctivae and EOM are normal. Pupils are equal, round, and reactive to light. Right conjunctiva  is not injected. Left conjunctiva is not injected. No scleral icterus.       Mild discharge eyelashes bilaterally  Neck: Normal range of motion. Neck supple.  Cardiovascular: Normal rate, S1 normal and S2 normal.  An irregular rhythm present.  No murmur heard. Pulmonary/Chest: He has decreased breath sounds in the right lower field. He has no wheezes. He has no rhonchi. He has rales in the right lower field.       slightly increased WOB  Lymphadenopathy:    He has no cervical adenopathy.  Skin: Skin is warm and dry. No rash noted.  Psychiatric: He has a normal mood and affect.       Assessment & Plan:

## 2010-12-04 NOTE — Patient Instructions (Addendum)
Hold on the sudafed. May use simple mucinex for cough with plenty of fluid. I do think Tony Camacho has a pneumonia or lung infection. I would like to treat this with levaquin once daily for 7 days. After antibiotics, he should start feeling better in next 1-2days, cough may last weeks. Please watch out for return of fever >101.5, worsening cough or trouble breathing, or not improving as expected.  If that is the case, please return here or seek urgent medical care.  Pneumonia, Adult Pneumonia is an infection of the lungs.  CAUSES Pneumonia may be caused by bacteria or a virus. Usually, these infections are caused by breathing infectious particles into the lungs (respiratory tract). SYMPTOMS   Cough.   Fever.   Chest pain.   Increased rate of breathing.   Wheezing.   Mucus production.  DIAGNOSIS  If you have the common symptoms of pneumonia, your caregiver will typically confirm the diagnosis with a chest X-ray. The X-ray will show an abnormality in the lung (pulmonary infiltrate) if you have pneumonia. Other tests of your blood, urine, or sputum may be done to find the specific cause of your pneumonia. Your caregiver may also do tests (blood gases or pulse oximetry) to see how well your lungs are working. TREATMENT  Some forms of pneumonia may be spread to other people when you cough or sneeze. You may be asked to wear a mask before and during your exam. Pneumonia that is caused by bacteria is treated with antibiotic medicine. Pneumonia that is caused by the influenza virus may be treated with an antiviral medicine. Most other viral infections must run their course. These infections will not respond to antibiotics.  PREVENTION A pneumococcal shot (vaccine) is available to prevent a common bacterial cause of pneumonia. This is usually suggested for:  People over 36 years old.   Patients on chemotherapy.   People with chronic lung problems, such as bronchitis or emphysema.   People  with immune system problems.  If you are over 65 or have a high risk condition, you may receive the pneumococcal vaccine if you have not received it before. In some countries, a routine influenza vaccine is also recommended. This vaccine can help prevent some cases of pneumonia.You may be offered the influenza vaccine as part of your care. If you smoke, it is time to quit. You may receive instructions on how to stop smoking. Your caregiver can provide medicines and counseling to help you quit. HOME CARE INSTRUCTIONS   Cough suppressants may be used if you are losing too much rest. However, coughing protects you by clearing your lungs. You should avoid using cough suppressants if you can.   Your caregiver may have prescribed medicine if he or she thinks your pneumonia is caused by a bacteria or influenza. Finish your medicine even if you start to feel better.   Your caregiver may also prescribe an expectorant. This loosens the mucus to be coughed up.   Only take over-the-counter or prescription medicines for pain, discomfort, or fever as directed by your caregiver.   Do not smoke. Smoking is a common cause of bronchitis and can contribute to pneumonia. If you are a smoker and continue to smoke, your cough may last several weeks after your pneumonia has cleared.   A cold steam vaporizer or humidifier in your room or home may help loosen mucus.   Coughing is often worse at night. Sleeping in a semi-upright position in a recliner or using a couple pillows  under your head will help with this.   Get rest as you feel it is needed. Your body will usually let you know when you need to rest.  SEEK IMMEDIATE MEDICAL CARE IF:   Your illness becomes worse. This is especially true if you are elderly or weakened from any other disease.   You cannot control your cough with suppressants and are losing sleep.   You begin coughing up blood.   You develop pain which is getting worse or is uncontrolled with  medicines.   You have a fever.   Any of the symptoms which initially brought you in for treatment are getting worse rather than better.   You develop shortness of breath or chest pain.  MAKE SURE YOU:   Understand these instructions.   Will watch your condition.   Will get help right away if you are not doing well or get worse.  Document Released: 01/22/2005 Document Revised: 10/04/2010 Document Reviewed: 04/13/2010 [facility] Patient Information 2012 [place], [place].

## 2010-12-04 NOTE — Assessment & Plan Note (Addendum)
Concern for PNA given lung findings - check CXR - possible RUL/RML PNA. Will treat as pneumonia with levaquin 500mg  x 7 days. Lab Results  Component Value Date   CREATININE 0.9 07/11/2010  Off coumadin 2/2 hemorrhagic stroke 2011, only on 81mg  ASA for Afib with pacer.  Doubt CVA related changes though. Discussed red flags to return or seek urgent care. Maintaining O2 sat, no current resp distress, deemed ok for outpatient treatment, however with close monitoring (wife can watch at home).

## 2010-12-05 ENCOUNTER — Ambulatory Visit: Payer: Medicare Other | Admitting: Family Medicine

## 2011-01-03 IMAGING — CT CT HEAD WO/W CM
1 of 2 series · 13 of 30 positions shown, 17 images · IV contrast (75CC OMNI 300)
Comparison: none

[Series 2: head w/o · axial · non-contrast · 0.49mm/px · z∈[+31,+161]mm · 13 of 28 slices shown, 17 images]
[im 2/28  brain]
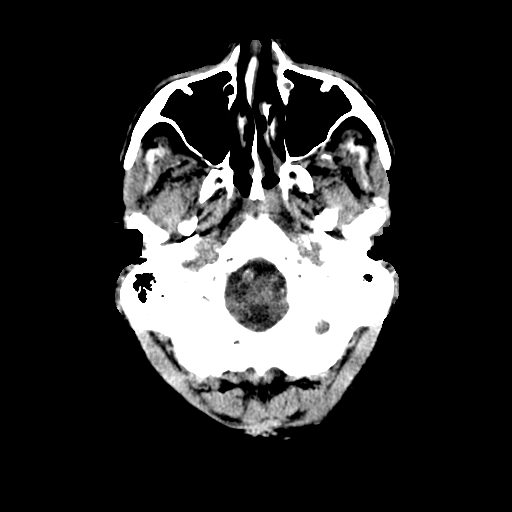
[im 2/28  bone]
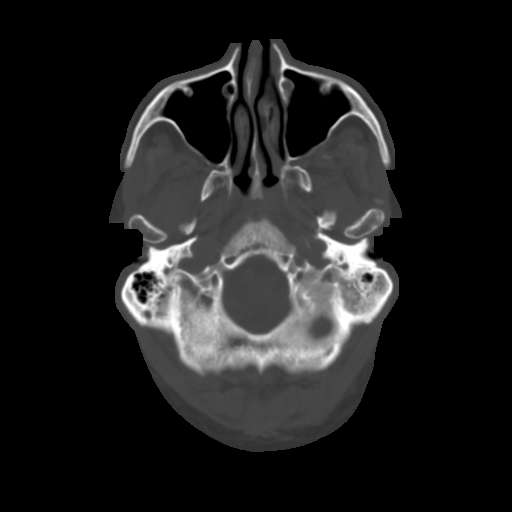
[im 4/28  brain]
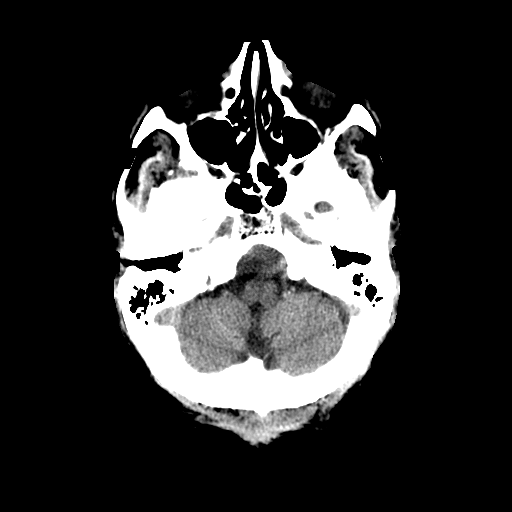
[im 6/28  brain]
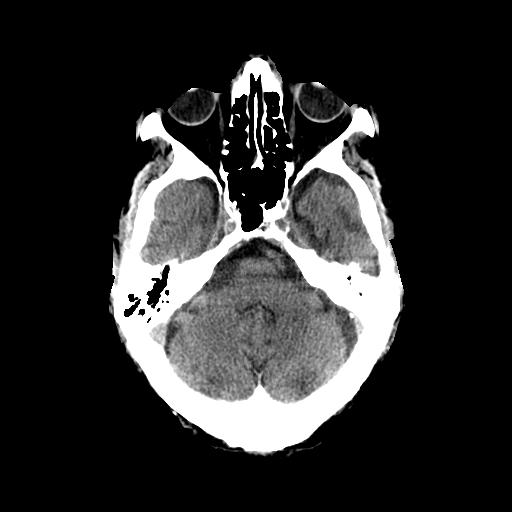
[im 8/28  brain]
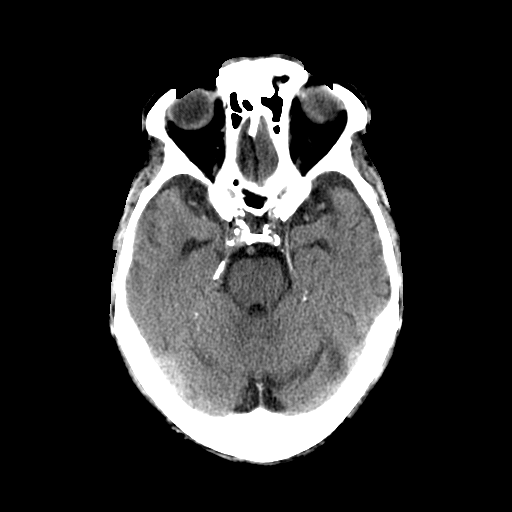
[im 10/28  brain]
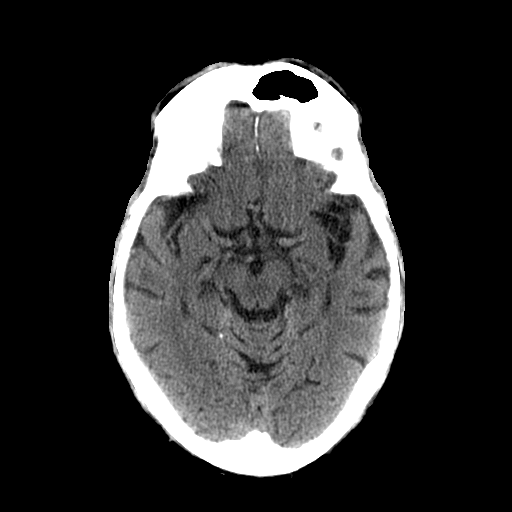
[im 10/28  bone]
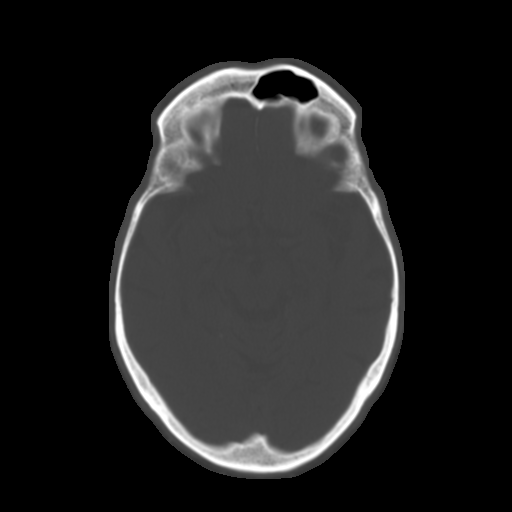
[im 12/28  brain]
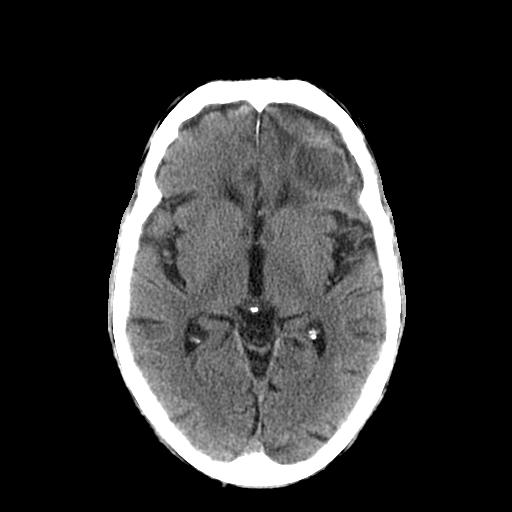
[im 14/28  brain]
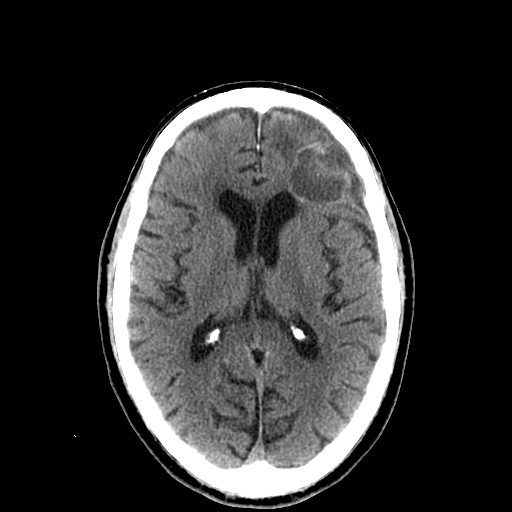
[im 16/28  brain]
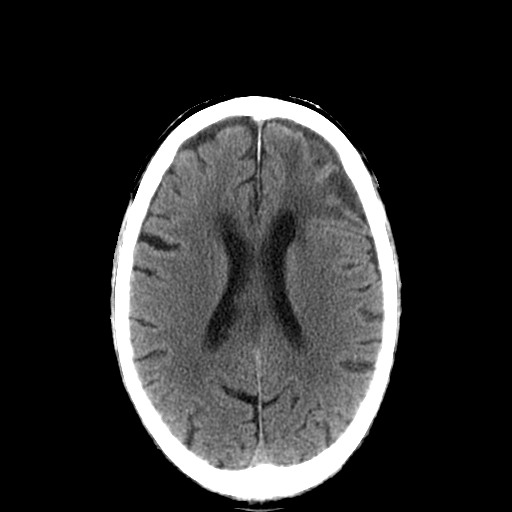
[im 18/28  brain]
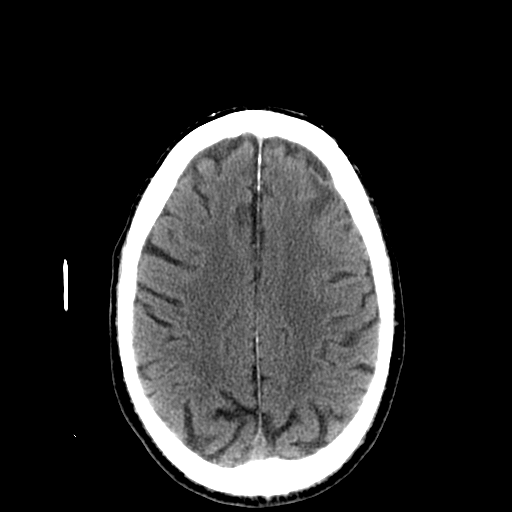
[im 18/28  bone]
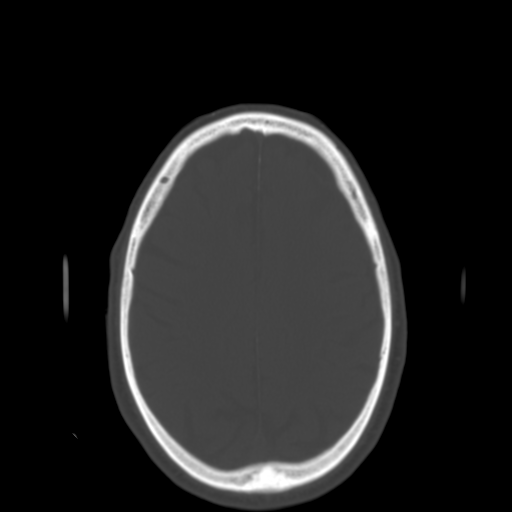
[im 20/28  brain]
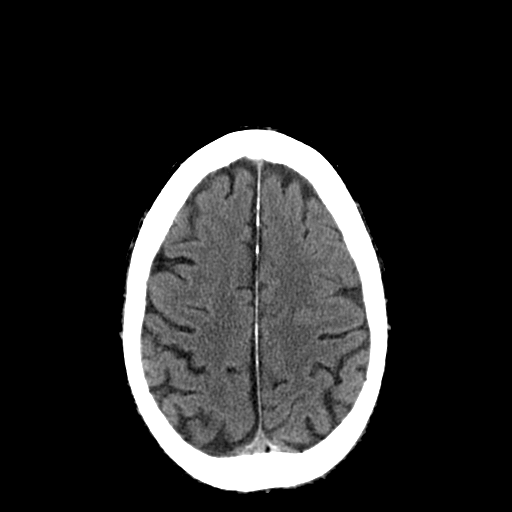
[im 22/28  brain]
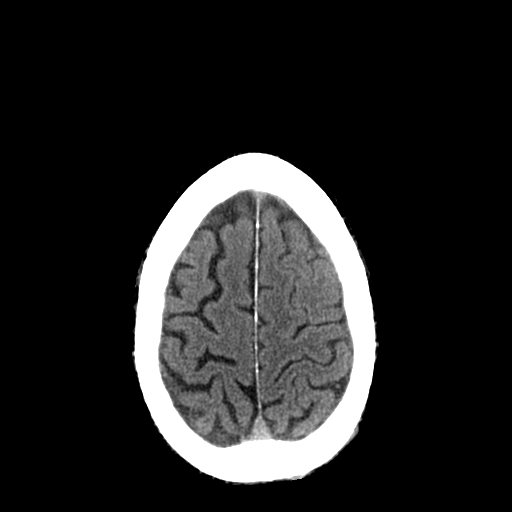
[im 24/28  brain]
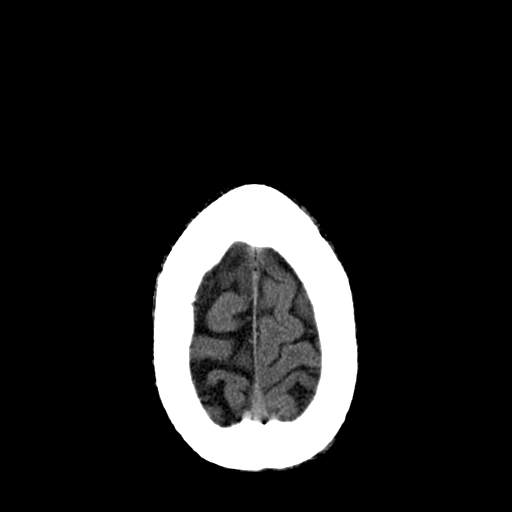
[im 26/28  brain]
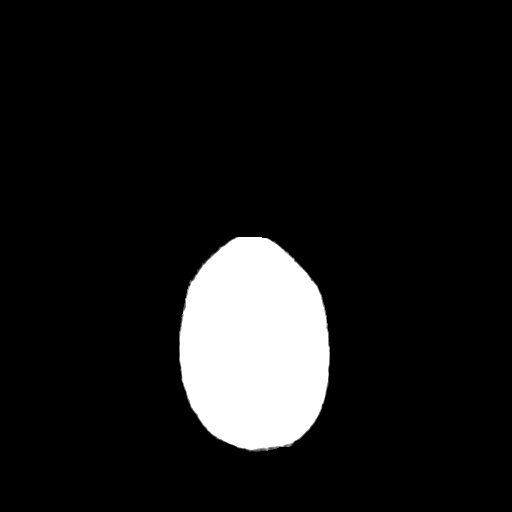
[im 26/28  bone]
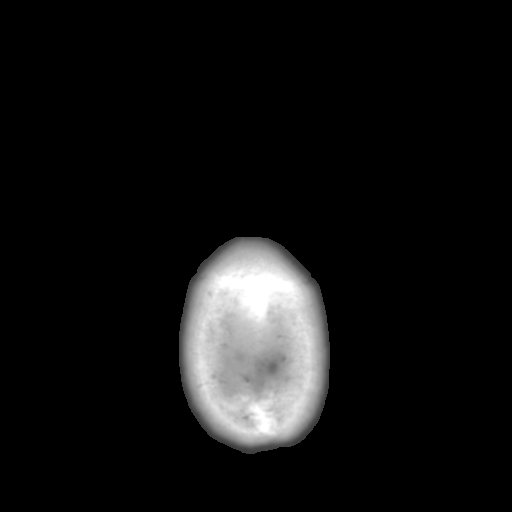

[13 of 30 positions shown; findings below may reference images not displayed]

BUN and creatinine were obtained on site at [HOSPITAL] at
[HOSPITAL].
Results:  BUN 15 mg/dL,  Creatinine 0.8 mg/dL.

This examination was performed at [HOSPITAL] at [HOSPITAL]. The interpretation will be provided by [REDACTED].

## 2011-01-06 DIAGNOSIS — G40909 Epilepsy, unspecified, not intractable, without status epilepticus: Secondary | ICD-10-CM

## 2011-01-06 HISTORY — DX: Epilepsy, unspecified, not intractable, without status epilepticus: G40.909

## 2011-01-08 ENCOUNTER — Encounter: Payer: Self-pay | Admitting: Family Medicine

## 2011-01-08 ENCOUNTER — Ambulatory Visit (INDEPENDENT_AMBULATORY_CARE_PROVIDER_SITE_OTHER): Payer: Medicare Other | Admitting: Family Medicine

## 2011-01-08 VITALS — BP 120/84 | HR 89 | Temp 97.7°F | Ht 68.0 in | Wt 164.0 lb

## 2011-01-08 DIAGNOSIS — M1710 Unilateral primary osteoarthritis, unspecified knee: Secondary | ICD-10-CM

## 2011-01-08 DIAGNOSIS — M25561 Pain in right knee: Secondary | ICD-10-CM

## 2011-01-08 MED ORDER — MELOXICAM 7.5 MG PO TABS: 7.5000 mg | ORAL_TABLET | Freq: Every day | ORAL | Status: AC

## 2011-01-08 MED ORDER — TRAMADOL HCL 50 MG PO TABS: 50.0000 mg | ORAL_TABLET | Freq: Four times a day (QID) | ORAL | Status: AC | PRN

## 2011-01-08 MED ORDER — TRAMADOL HCL 50 MG PO TABS: 50.0000 mg | ORAL_TABLET | Freq: Four times a day (QID) | ORAL | Status: DC | PRN

## 2011-01-08 NOTE — Progress Notes (Signed)
  Patient Name: Tony Camacho Date of Birth: March 22, 1924 Age: 77 y.o. Medical Record Number: [id number] Gender: male  History of Present Illness:  Tony Camacho is a 77 y.o. very pleasant male patient who presents with the following:  Very pleasant gentleman, so in 75 months ago for knee pain. At that point aspirated his knee and injected his knee with intra-articular corticosteroid. He did well and had immediate relief of symptoms and was highly functional until the last one to 2 weeks. At this point he is developed some more pain and swelling. He is generally very active and working out 5 days a week with his wife. He is not having asymptomatic giving way or locking up in his joint. These films were reviewed with he and his wife, and he does have some very significant patellofemoral joint osteoarthritis and to a lesser degree some mild to moderate medial compartmental arthritis.  Past Medical History, Surgical History, Social History, Family History, and Problem List have been reviewed in EHR and updated if relevant.  Review of Systems:  GEN: No fevers, chills. Nontoxic. Primarily MSK c/o today. MSK: Detailed in the HPI GI: tolerating PO intake without difficulty Neuro: No numbness, parasthesias, or tingling associated. Otherwise the pertinent positives of the ROS are noted above.    Physical Examination: Filed Vitals:   01/08/11 1513  BP: 120/84  Pulse: 89  Temp: 97.7 F (36.5 C)  TempSrc: Oral  Height: 5\' 8"  (1.727 m)  Weight: 164 lb (74.39 kg)  SpO2: 99%    Body mass index is 24.94 kg/(m^2).   GEN: WDWN, NAD, Non-toxic, Alert & Oriented x 3 HEENT: Atraumatic, Normocephalic.  Ears and Nose: No external deformity. EXTR: No clubbing/cyanosis/edema NEURO: Normal gait.  PSYCH: Normally interactive. Conversant. Not depressed or anxious appearing.  Calm demeanor.   Knee:  R Gait: Normal heel toe pattern ROM: loss of 3 deg ext. To 110 deg Effusion: small Echymosis or edema:  none Patellar tendon NT Painful PLICA: neg Patellar grind: pos Notable crepitus Medial and lateral patellar facet loading: negative medial and lateral joint lines:NT Mcmurray's neg Flexion-pinch neg Varus and valgus stress: stable Lachman: neg Ant and Post drawer: neg Hip abduction, IR, ER: WNL Hip flexion str: 5/5 Hip abd: 5/5 Quad: 5/5 VMO atrophy:atrophy Hamstring concentric and eccentric: 5/5   Assessment and Plan: 1. Osteoarthritis, knee  meloxicam (MOBIC) 7.5 MG tablet, traMADol (ULTRAM) 50 MG tablet, DISCONTINUED: traMADol (ULTRAM) 50 MG tablet  2. Right knee pain      Right knee pain, flare. Osteoarthritis. I think it is reasonable at his age, symptomatically treat this with some intermittent NSAIDs, and some tramadol as needed. For right now, given his excellent response to his prior intra-articular knee injection, think it is reasonable to do up to 2 or 3 times a year. He would be a good candidate for Synvisc, as well.  Knee Injection, R Patient verbally consented to procedure. Risks (including potential rare risk of infection), benefits, and alternatives explained. Sterilely prepped with Chloraprep. Ethyl cholride used for anesthesia. 9 cc Lidocaine 1% mixed with 1 cc of Depo-Medrol 40 mg injected using the anterolateral approach without difficulty. No complications with procedure and tolerated well. Patient had decreased pain post-injection.

## 2011-01-15 ENCOUNTER — Other Ambulatory Visit: Payer: Self-pay | Admitting: Family Medicine

## 2011-01-16 ENCOUNTER — Other Ambulatory Visit (INDEPENDENT_AMBULATORY_CARE_PROVIDER_SITE_OTHER): Payer: Medicare Other

## 2011-01-16 DIAGNOSIS — E119 Type 2 diabetes mellitus without complications: Secondary | ICD-10-CM

## 2011-01-16 DIAGNOSIS — D696 Thrombocytopenia, unspecified: Secondary | ICD-10-CM

## 2011-01-16 DIAGNOSIS — I10 Essential (primary) hypertension: Secondary | ICD-10-CM

## 2011-01-16 LAB — CBC WITH DIFFERENTIAL/PLATELET
Basophils Absolute: 0 10*3/uL (ref 0.0–0.1)
Eosinophils Absolute: 0.5 10*3/uL (ref 0.0–0.7)
Lymphocytes Relative: 29.9 % (ref 12.0–46.0)
MCHC: 33.7 g/dL (ref 30.0–36.0)
Monocytes Relative: 8.6 % (ref 3.0–12.0)
Neutrophils Relative %: 54.4 % (ref 43.0–77.0)
Platelets: 113 10*3/uL — ABNORMAL LOW (ref 150.0–400.0)
RDW: 13.7 % (ref 11.5–14.6)

## 2011-01-16 LAB — RENAL FUNCTION PANEL
Albumin: 4 g/dL (ref 3.5–5.2)
BUN: 11 mg/dL (ref 6–23)
Chloride: 105 mEq/L (ref 96–112)
Creatinine, Ser: 0.9 mg/dL (ref 0.4–1.5)
Glucose, Bld: 132 mg/dL — ABNORMAL HIGH (ref 70–99)
Phosphorus: 3.9 mg/dL (ref 2.3–4.6)

## 2011-01-16 LAB — HEMOGLOBIN A1C: Hgb A1c MFr Bld: 7.3 % — ABNORMAL HIGH (ref 4.6–6.5)

## 2011-01-16 LAB — MICROALBUMIN / CREATININE URINE RATIO
Creatinine,U: 57.2 mg/dL
Microalb Creat Ratio: 0.5 mg/g (ref 0.0–30.0)
Microalb, Ur: 0.3 mg/dL (ref 0.0–1.9)

## 2011-01-23 ENCOUNTER — Ambulatory Visit (INDEPENDENT_AMBULATORY_CARE_PROVIDER_SITE_OTHER): Payer: Medicare Other | Admitting: Family Medicine

## 2011-01-23 ENCOUNTER — Encounter: Payer: Self-pay | Admitting: Family Medicine

## 2011-01-23 VITALS — BP 136/72 | HR 92 | Temp 97.7°F | Ht 68.0 in | Wt 169.8 lb

## 2011-01-23 DIAGNOSIS — R070 Pain in throat: Secondary | ICD-10-CM

## 2011-01-23 DIAGNOSIS — D696 Thrombocytopenia, unspecified: Secondary | ICD-10-CM

## 2011-01-23 DIAGNOSIS — M25569 Pain in unspecified knee: Secondary | ICD-10-CM

## 2011-01-23 DIAGNOSIS — M25561 Pain in right knee: Secondary | ICD-10-CM

## 2011-01-23 DIAGNOSIS — E119 Type 2 diabetes mellitus without complications: Secondary | ICD-10-CM

## 2011-01-23 DIAGNOSIS — I10 Essential (primary) hypertension: Secondary | ICD-10-CM

## 2011-01-23 NOTE — Assessment & Plan Note (Signed)
This was transient - completely gone now Diff includes gerd or uri/ ? apthous ulcer -- but for now since symptoms are gone - will just observe  Would consider ENT ref if symptoms return Nl exam today No hoarseness

## 2011-01-23 NOTE — Patient Instructions (Signed)
If throat symptoms return - alert me  Stop the mobic due to bleeding /stroke/ GI risks  Tramadol is ok for pain but be careful about sedation and dizziness Check sugar every other day am and every other day 2 hours after a meal  Sugar should come down off ice cream- if it does not let me know  Schedule annual exam 6 months and then follow up

## 2011-01-23 NOTE — Assessment & Plan Note (Signed)
Improved platelets without bleeding or bruising at 119 Will continue to monitor

## 2011-01-23 NOTE — Progress Notes (Signed)
Subjective:    Patient ID: Tony Camacho, male    DOB: Dec 18, 1924, 77 y.o.   MRN: [id number]  HPI Here for f/u of HTN and DM and thrombocytopenia and throat pain  Is doing fine and feeling good   Is having a new problem- hurts in mouth and in throat  Mostly on R side of throat - perhaps for few weeks  No coating on toungue No uri symptoms  No heartburn or acid reflux  No cough no fever   bp is   136/74  Today No cp or palpitations or headaches or edema  No side effects to medicines On tenormin , cannot  take ace  Dr Patsy Lager gave him nsaid for knee pain - was concerned about that -- and is ready to stop it  Also given tramadol  Wt is up 5 lb with bmi of 25  Thrombocytopenia - stable to improved Platelet 113- up from 90  No excessive bruising or bleeding  DM a1c is up from 6.8 to 7.3  Sugars have been ok 120-130 (used to be a bit lower)  Takes metformin bid Has been holiday eating - ice cream every night  opthy- done in sept - no retinop Foot care- no problems  Diet - worse  exercise  -- is good about that , goes 5 days per week to the gym microalb was normal   utd on imms   Patient Active Problem List  Diagnoses  . DIABETES MELLITUS, TYPE II  . HYPERCHOLESTEROLEMIA, PURE  . ANEMIA NOS  . THROMBOCYTOPENIA  . HYPERTENSION  . ATRIAL FIBRILLATION  . CEREBRAL HEMORRHAGE  . 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 OSTEOARTHRITIS  . Right knee pain  . Fungal nail infection  . CAP (community acquired pneumonia)  . Throat pain   Past Medical History  Diagnosis Date  . Diabetes mellitus     type II  . Atrial fibrillation     stopped coumadin due to cerebral hem.  . Hypertension     intolerance to ace  . Anemia   . Arthritis     osteoarthritis  . Cataract   . Macular degeneration   . Disc degeneration, lumbar   . Cerebral hemorrhage 2011    amyloid angiopathy with cerebral hemorrhage  . CAP (community acquired pneumonia) 11/2010    RUL   Past  Surgical History  Procedure Date  . Left ear surgery   . Hernia repair     as a child  . Colon surgery     bowel obstruction surgery  . Appendectomy   . Tonsillectomy   . Coronary angioplasty     CAD//2D ECHO - mild LVF and dilated EF 35-45%(12/2003)//cardiolite abn (07/2005)  . Carotid screen 10/2002    mild stenosis, neg AAA screen, ABI's normal  . Colonoscopy 12/2005    polyp, tics  . Ct brain 10/2007    small subdural hygromas-- no acute change  . 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 Pacemaker insertion    History  Substance Use Topics  . Smoking status: Former Games developer  . Smokeless tobacco: Not on file  . Alcohol Use: Yes     Rarely   Family History  Problem Relation Age of Onset  . Diabetes Mother   . Cancer Mother     stomach  . Diabetes Father   . Diabetes Sister   . Diabetes Brother    Allergies  Allergen Reactions  .  Lisinopril     REACTION: hypotension  . Penicillins     REACTION: ?  . Rofecoxib     REACTION: edema  . Rosiglitazone Maleate     REACTION: swelling  . Vancomycin     ?? Question reaction to this (said vanemycin in file at home)   Current Outpatient Prescriptions on File Prior to Visit  Medication Sig Dispense Refill  . aspirin 81 MG tablet Take 81 mg by mouth daily.        Marland Kitchen atenolol (TENORMIN) 50 MG tablet TAKE 1/2 TABLET BY MOUTH ONCE DAILY  45 tablet  2  . carbonyl iron ([facility] IRON) 45 MG TABS Take 45 mg by mouth daily.        . cholecalciferol (VITAMIN D) 1000 UNITS tablet Take two tablets by mouth daily       . Glucosamine-Chondroit-Vit C-Mn (GLUCOSAMINE 1500 COMPLEX PO) 2 every day by mouth       . meloxicam (MOBIC) 7.5 MG tablet Take 1 tablet (7.5 mg total) by mouth daily.  30 tablet  3  . metFORMIN (GLUCOPHAGE) 500 MG tablet Take 500 mg by mouth 2 (two) times daily with a meal.        . Methylfol-Methylcob-Acetylcyst (CEREFOLIN NAC) 6-2-600 MG TABS Take 1 tablet by mouth once a day       .  Multiple Vitamin (MULTIVITAMIN PO) Take 1 daily       . Multiple Vitamins-Minerals (OCUVITE EXTRA) TABS As directed       . Omega-3 Fatty Acids (FISH OIL) 1000 MG CAPS Take 1 capsule by mouth 2 (two) times daily.       . rosuvastatin (CRESTOR) 10 MG tablet Take 10 mg by mouth daily.        . Tamsulosin HCl (FLOMAX) 0.4 MG CAPS Take 0.4 mg by mouth daily.        . Thiamine HCl (VITAMIN B-1) 250 MG tablet Take 250 mg by mouth daily.             Review of Systems Review of Systems  Constitutional: Negative for fever, appetite change, fatigue and unexpected weight change.  Eyes: Negative for pain and visual disturbance.  ENT neg for congestion or post nasal drip Respiratory: Negative for cough and shortness of breath.   Cardiovascular: Negative for cp or palpitations    Gastrointestinal: Negative for nausea, diarrhea and constipation.  Genitourinary: Negative for urgency and frequency.  Skin: Negative for pallor or rash   MSK pos for knee pain  Neurological: Negative for weakness, light-headedness, numbness and headaches.  Hematological: Negative for adenopathy. Does not bruise/bleed easily.  Psychiatric/Behavioral: Negative for dysphoric mood. The patient is not nervous/anxious.          Objective:   Physical Exam  Constitutional: He appears well-developed and well-nourished. No distress.  HENT:  Head: Normocephalic and atraumatic.  Right Ear: External ear normal.  Left Ear: External ear normal.  Nose: Nose normal.  Mouth/Throat: Oropharynx is clear and moist.       Throat clear No sinus tenderness  Eyes: Conjunctivae and EOM are normal. Pupils are equal, round, and reactive to light. No scleral icterus.  Neck: Normal range of motion. Neck supple. No JVD present. Carotid bruit is not present. No thyromegaly present.  Cardiovascular: Normal rate, regular rhythm, normal heart sounds and intact distal pulses.  Exam reveals no gallop.   Pulmonary/Chest: Effort normal and breath  sounds normal. No respiratory distress. He has no wheezes.  Abdominal: Soft. Bowel  sounds are normal. He exhibits no distension, no abdominal bruit and no mass. There is no tenderness.  Musculoskeletal: He exhibits no edema and no tenderness.       Poor rom knees  Lymphadenopathy:    He has no cervical adenopathy.  Neurological: He is alert. He has normal reflexes. No cranial nerve deficit. He exhibits normal muscle tone. Coordination normal.  Skin: Skin is warm and dry. No rash noted. No erythema. No pallor.  Psychiatric: He has a normal mood and affect.          Assessment & Plan:

## 2011-01-23 NOTE — Assessment & Plan Note (Signed)
I did unfortunately have pt stop the mobic in light of past hx of hemorrhagic stoke/ HTN/ etc  Will use tramadol with caution and keep me updated Hx of OA with effusion

## 2011-01-23 NOTE — Assessment & Plan Note (Signed)
Worse with nightly ice cream and more sweets  Disc DM diet again and confident he can do better  enc to check sugar intermittently am and PP If not imp let me know Lab Results  Component Value Date   HGBA1C 7.3* 01/16/2011    F/u in 6 mo after labs

## 2011-01-23 NOTE — Assessment & Plan Note (Signed)
bp in fair control at this time  No changes needed  Disc lifstyle change with low sodium diet and exercise   

## 2011-01-29 ENCOUNTER — Inpatient Hospital Stay (HOSPITAL_COMMUNITY): Payer: Medicare Other

## 2011-01-29 ENCOUNTER — Emergency Department (HOSPITAL_COMMUNITY): Payer: Medicare Other

## 2011-01-29 ENCOUNTER — Encounter (HOSPITAL_COMMUNITY): Payer: Self-pay | Admitting: Emergency Medicine

## 2011-01-29 ENCOUNTER — Inpatient Hospital Stay (HOSPITAL_COMMUNITY)
Admission: AD | Admit: 2011-01-29 | Discharge: 2011-02-02 | DRG: 057 | Disposition: A | Discharge status: Home Health | Payer: Medicare Other | Attending: Internal Medicine | Admitting: Internal Medicine

## 2011-01-29 DIAGNOSIS — Z880 Allergy status to penicillin: Secondary | ICD-10-CM

## 2011-01-29 DIAGNOSIS — D696 Thrombocytopenia, unspecified: Secondary | ICD-10-CM | POA: Diagnosis not present

## 2011-01-29 DIAGNOSIS — F0390 Unspecified dementia without behavioral disturbance: Secondary | ICD-10-CM | POA: Diagnosis present

## 2011-01-29 DIAGNOSIS — I4891 Unspecified atrial fibrillation: Secondary | ICD-10-CM | POA: Diagnosis present

## 2011-01-29 DIAGNOSIS — Z9861 Coronary angioplasty status: Secondary | ICD-10-CM

## 2011-01-29 DIAGNOSIS — I2510 Atherosclerotic heart disease of native coronary artery without angina pectoris: Secondary | ICD-10-CM | POA: Diagnosis present

## 2011-01-29 DIAGNOSIS — M51379 Other intervertebral disc degeneration, lumbosacral region without mention of lumbar back pain or lower extremity pain: Secondary | ICD-10-CM | POA: Diagnosis present

## 2011-01-29 DIAGNOSIS — Z833 Family history of diabetes mellitus: Secondary | ICD-10-CM

## 2011-01-29 DIAGNOSIS — I10 Essential (primary) hypertension: Secondary | ICD-10-CM | POA: Diagnosis present

## 2011-01-29 DIAGNOSIS — Z881 Allergy status to other antibiotic agents status: Secondary | ICD-10-CM

## 2011-01-29 DIAGNOSIS — M5137 Other intervertebral disc degeneration, lumbosacral region: Secondary | ICD-10-CM | POA: Diagnosis present

## 2011-01-29 DIAGNOSIS — Z950 Presence of cardiac pacemaker: Secondary | ICD-10-CM

## 2011-01-29 DIAGNOSIS — E119 Type 2 diabetes mellitus without complications: Secondary | ICD-10-CM | POA: Diagnosis present

## 2011-01-29 DIAGNOSIS — H3530 Unspecified macular degeneration: Secondary | ICD-10-CM | POA: Diagnosis present

## 2011-01-29 DIAGNOSIS — Z87891 Personal history of nicotine dependence: Secondary | ICD-10-CM

## 2011-01-29 DIAGNOSIS — I69998 Other sequelae following unspecified cerebrovascular disease: Principal | ICD-10-CM

## 2011-01-29 DIAGNOSIS — Z23 Encounter for immunization: Secondary | ICD-10-CM

## 2011-01-29 DIAGNOSIS — Z888 Allergy status to other drugs, medicaments and biological substances status: Secondary | ICD-10-CM

## 2011-01-29 DIAGNOSIS — R569 Unspecified convulsions: Secondary | ICD-10-CM | POA: Diagnosis present

## 2011-01-29 LAB — GLUCOSE, CAPILLARY
Glucose-Capillary: 199 mg/dL — ABNORMAL HIGH (ref 70–99)
Glucose-Capillary: 165 mg/dL — ABNORMAL HIGH (ref 70–99)
Glucose-Capillary: 165 mg/dL — ABNORMAL HIGH (ref 70–99)

## 2011-01-29 LAB — COMPREHENSIVE METABOLIC PANEL
BUN: 15 mg/dL (ref 6–23)
Calcium: 9.8 mg/dL (ref 8.4–10.5)
GFR calc Af Amer: >90 mL/min (ref >90)
Glucose, Bld: 206 mg/dL — ABNORMAL HIGH (ref 70–99)
Sodium: 135 mEq/L (ref 135–145)
Total Protein: 6.8 g/dL (ref 6.0–8.3)

## 2011-01-29 LAB — DIFFERENTIAL
Basophils Relative: 0 % (ref 0–1)
Eosinophils Absolute: 0.2 10*3/uL (ref 0.0–0.7)
Eosinophils Relative: 2 % (ref 0–5)
Lymphs Abs: 1.2 10*3/uL (ref 0.7–4.0)
Monocytes Absolute: 0.6 10*3/uL (ref 0.1–1.0)

## 2011-01-29 LAB — CBC
MCH: 31.5 pg (ref 26.0–34.0)
MCHC: 34.4 g/dL (ref 30.0–36.0)
MCV: 91.5 fL (ref 78.0–100.0)
Platelets: 104 10*3/uL — ABNORMAL LOW (ref 150–400)
RBC: 4.13 MIL/uL — ABNORMAL LOW (ref 4.22–5.81)

## 2011-01-29 LAB — CARDIAC PANEL(CRET KIN+CKTOT+MB+TROPI)
CK, MB: 13.3 ng/mL (ref 0.3–4.0)
CK, MB: 15.3 ng/mL (ref 0.3–4.0)
Relative Index: 2.9 — ABNORMAL HIGH (ref 0.0–2.5)
Relative Index: 1.5 (ref 0.0–2.5)
Troponin I: <0.3 ng/mL (ref <0.30)
Troponin I: <0.3 ng/mL (ref <0.30)

## 2011-01-29 LAB — URINALYSIS, ROUTINE W REFLEX MICROSCOPIC
Leukocytes, UA: NEGATIVE
Nitrite: NEGATIVE
Specific Gravity, Urine: 1.016 (ref 1.005–1.030)
Urobilinogen, UA: 1 mg/dL (ref 0.0–1.0)

## 2011-01-29 LAB — POCT I-STAT 3, ART BLOOD GAS (G3+)
Patient temperature: 98.6
pH, Arterial: 7.404 (ref 7.350–7.450)

## 2011-01-29 LAB — URINE MICROSCOPIC-ADD ON

## 2011-01-29 LAB — LACTIC ACID, PLASMA: Lactic Acid, Venous: 2.8 mmol/L — ABNORMAL HIGH (ref 0.5–2.2)

## 2011-01-29 MED ORDER — SODIUM CHLORIDE 0.9 % IV SOLN: INTRAVENOUS | Status: DC

## 2011-01-29 MED ORDER — TAMSULOSIN HCL 0.4 MG PO CAPS
0.4000 mg | ORAL_CAPSULE | Freq: Every day | ORAL | Status: DC
  Administered 2011-01-29 – 2011-02-01 (×4): 0.4 mg via ORAL
  Filled 2011-01-29 (×5): qty 1

## 2011-01-29 MED ORDER — ONDANSETRON HCL 4 MG/2ML IJ SOLN: 4.0000 mg | Freq: Four times a day (QID) | INTRAMUSCULAR | Status: DC | PRN

## 2011-01-29 MED ORDER — PNEUMOCOCCAL VAC POLYVALENT 25 MCG/0.5ML IJ INJ
0.5000 mL | INJECTION | INTRAMUSCULAR | Status: AC
  Administered 2011-01-30: 0.5 mL via INTRAMUSCULAR
  Filled 2011-01-29: qty 0.5

## 2011-01-29 MED ORDER — CARBONYL IRON 45 MG PO TABS: 45.0000 mg | ORAL_TABLET | Freq: Every day | ORAL | Status: DC

## 2011-01-29 MED ORDER — SODIUM CHLORIDE 0.9 % IV SOLN
1000.0000 mg | INTRAVENOUS | Status: AC
  Administered 2011-01-29: 1000 mg via INTRAVENOUS
  Filled 2011-01-29: qty 20

## 2011-01-29 MED ORDER — BIOTENE DRY MOUTH MT LIQD
15.0000 mL | Freq: Two times a day (BID) | OROMUCOSAL | Status: DC
  Administered 2011-01-29 – 2011-02-01 (×6): 15 mL via OROMUCOSAL

## 2011-01-29 MED ORDER — ACETAMINOPHEN 325 MG PO TABS: 650.0000 mg | ORAL_TABLET | Freq: Four times a day (QID) | ORAL | Status: DC | PRN

## 2011-01-29 MED ORDER — ASPIRIN 81 MG PO CHEW
81.0000 mg | CHEWABLE_TABLET | Freq: Every day | ORAL | Status: DC
  Administered 2011-01-29 – 2011-02-02 (×5): 81 mg via ORAL
  Filled 2011-01-29 (×5): qty 1

## 2011-01-29 MED ORDER — HYDROMORPHONE HCL PF 1 MG/ML IJ SOLN: 0.5000 mg | INTRAMUSCULAR | Status: DC | PRN

## 2011-01-29 MED ORDER — LORAZEPAM 2 MG/ML IJ SOLN
INTRAMUSCULAR | Status: AC
  Filled 2011-01-29: qty 1

## 2011-01-29 MED ORDER — FERROUS SULFATE 325 (65 FE) MG PO TABS
325.0000 mg | ORAL_TABLET | Freq: Every day | ORAL | Status: DC
  Administered 2011-01-29 – 2011-02-02 (×5): 325 mg via ORAL
  Filled 2011-01-29 (×6): qty 1

## 2011-01-29 MED ORDER — ATENOLOL 25 MG PO TABS
25.0000 mg | ORAL_TABLET | Freq: Every day | ORAL | Status: DC
  Administered 2011-01-29 – 2011-02-02 (×5): 25 mg via ORAL
  Filled 2011-01-29 (×5): qty 1

## 2011-01-29 MED ORDER — ZOLPIDEM TARTRATE 5 MG PO TABS: 5.0000 mg | ORAL_TABLET | Freq: Every evening | ORAL | Status: DC | PRN

## 2011-01-29 MED ORDER — ACETAMINOPHEN 650 MG RE SUPP: 650.0000 mg | Freq: Four times a day (QID) | RECTAL | Status: DC | PRN

## 2011-01-29 MED ORDER — PHENYTOIN SODIUM 50 MG/ML IJ SOLN
100.0000 mg | Freq: Three times a day (TID) | INTRAMUSCULAR | Status: DC
  Filled 2011-01-29 (×4): qty 2

## 2011-01-29 MED ORDER — OXYCODONE HCL 5 MG PO TABS: 5.0000 mg | ORAL_TABLET | ORAL | Status: DC | PRN

## 2011-01-29 MED ORDER — ROSUVASTATIN CALCIUM 10 MG PO TABS
10.0000 mg | ORAL_TABLET | Freq: Every day | ORAL | Status: DC
  Administered 2011-01-30 – 2011-02-01 (×3): 10 mg via ORAL
  Filled 2011-01-29 (×5): qty 1

## 2011-01-29 MED ORDER — ALUM & MAG HYDROXIDE-SIMETH 200-200-20 MG/5ML PO SUSP: 30.0000 mL | Freq: Four times a day (QID) | ORAL | Status: DC | PRN

## 2011-01-29 MED ORDER — VITAMIN B-12 1000 MCG PO TABS
1000.0000 ug | ORAL_TABLET | Freq: Every day | ORAL | Status: DC
  Administered 2011-01-29 – 2011-02-02 (×5): 1000 ug via ORAL
  Filled 2011-01-29 (×5): qty 1

## 2011-01-29 MED ORDER — ONDANSETRON HCL 4 MG PO TABS: 4.0000 mg | ORAL_TABLET | Freq: Four times a day (QID) | ORAL | Status: DC | PRN

## 2011-01-29 MED ORDER — PHENYTOIN SODIUM 50 MG/ML IJ SOLN
100.0000 mg | Freq: Three times a day (TID) | INTRAMUSCULAR | Status: DC
  Administered 2011-01-29 – 2011-02-02 (×13): 100 mg via INTRAVENOUS
  Filled 2011-01-29 (×17): qty 2

## 2011-01-29 MED ORDER — INSULIN ASPART 100 UNIT/ML ~~LOC~~ SOLN
0.0000 [IU] | SUBCUTANEOUS | Status: DC
  Administered 2011-01-29: 1 [IU] via SUBCUTANEOUS
  Administered 2011-01-29 (×2): 2 [IU] via SUBCUTANEOUS
  Filled 2011-01-29 (×2): qty 3

## 2011-01-29 MED ORDER — ONDANSETRON HCL 4 MG/2ML IJ SOLN: 4.0000 mg | Freq: Three times a day (TID) | INTRAMUSCULAR | Status: AC | PRN

## 2011-01-29 NOTE — ED Notes (Signed)
Patient had a witnessed bu RN and EDP seizure arms and legs jerking. Currently post ictal

## 2011-01-29 NOTE — Progress Notes (Signed)
Utilization review completed.  

## 2011-01-29 NOTE — ED Notes (Signed)
Called out seizure PTA, wife states that patient was having what looked like seizure like activity for approx 10  Minutes while lying in bed. Post ictal upon arrival noted  Minor tongue trauma no urinary incontinent.patient became more alert when reaching the hosp. No prior history of seizures

## 2011-01-29 NOTE — ED Notes (Signed)
Wife states had seizure like activity tonight states for the past couple days has been saying the wrong word for thing. Patient denies pain at this time.

## 2011-01-29 NOTE — Progress Notes (Signed)
Subjective: Awake , oriented to person and place. Denies headaches or weakness. Per wife she has notice improvement.  Objective: Filed Vitals:   01/29/11 0430 01/29/11 0500 01/29/11 0607 01/29/11 1101  BP: 99/61 102/54 126/65 121/68  Pulse: 72 70 80 81  Temp:   97.9 F (36.6 C) 97.9 F (36.6 C)  TempSrc:   Oral Oral  Resp: 3 13 14 18   Height:   5\' 8"  (1.727 m)   Weight:   74.3 kg (163 lb 12.8 oz)   SpO2: 98% 95% 95% 95%   Weight change:   Intake/Output Summary (Last 24 hours) at 01/29/11 1444 Last data filed at 01/29/11 0835  Gross per 24 hour  Intake      2 ml  Output      0 ml  Net      2 ml    General: Alert, awake, oriented x3, in no acute distress.  HEENT: No bruits, no goiter.  Heart: Regular rate and rhythm, without murmurs, rubs, gallops.  Lungs: CTA, bilateral air movement.  Abdomen: Soft, nontender, nondistended, positive bowel sounds.  Neuro: Grossly intact, nonfocal. Extremities; no edema.   Lab Results:  [facility] 01/29/11 0111  NA 135  K 3.7  CL 99  CO2 25  GLUCOSE 206*  BUN 15  CREATININE 0.64  CALCIUM 9.8  MG --  PHOS --    Basename 01/29/11 0111  AST 18  ALT 14  ALKPHOS 90  BILITOT 0.6  PROT 6.8  ALBUMIN 3.8    Basename 01/29/11 0111  WBC 7.8  NEUTROABS 5.8  HGB 13.0  HCT 37.8*  MCV 91.5  PLT 104*    Basename 01/29/11 0746  CKTOTAL 460*  CKMB 13.3*  CKMBINDEX --  TROPONINI <0.30    Basename 01/29/11 0747  HGBA1C 7.6*   Studies/Results: Dg Chest 2 View  01/29/2011  *RADIOLOGY REPORT*  Clinical Data: Possible seizure.  CHEST - 2 VIEW  Comparison: Chest radiograph performed 12/04/2010  Findings: The lungs are well-aerated.  Vascular congestion is noted.  Mild bibasilar opacities may reflect atelectasis or possibly pneumonia.  Alternatively, mild interstitial edema could have a similar appearance.  There is no evidence of pleural effusion or pneumothorax.  The heart is enlarged.  A pacemaker is noted at the right chest wall,  with leads ending at the right atrium and right ventricle. No acute osseous abnormalities are seen.  IMPRESSION: Mild bibasilar airspace opacities may reflect atelectasis or possibly mild pneumonia.  Alternatively, given vascular congestion and cardiomegaly, mild interstitial edema could have a similar appearance.  Original Report Authenticated By: Tonia Ghent, M.D.   Ct Head Wo Contrast  01/29/2011  *RADIOLOGY REPORT*  Clinical Data: Seizure; abrasion to the forehead.  CT HEAD WITHOUT CONTRAST  Technique:  Contiguous axial images were obtained from the base of the skull through the vertex without contrast.  Comparison: Multiple prior CTs of the head, the most recent of which was performed 11/11/2009  Findings: There is no evidence of acute infarction, mass lesion, or intra- or extra-axial hemorrhage on CT.  Chronic encephalomalacia is noted at the left frontal lobe, with a pattern of increased attenuation peripherally along the gyral folds.  This is more apparent than on the prior CT, and likely reflects interval evolution of previously noted encephalomalacia due to a hemorrhagic stroke.  There is no evidence of recurrent bleed.  No significant mass effect or midline shift is seen.  There is minimal ex vacuo dilatation of the frontal horn of the left  lateral ventricle.  The posterior fossa, including the cerebellum, brainstem and fourth ventricle, is within normal limits.  The basal ganglia are unremarkable in appearance.  There is no evidence of fracture; visualized osseous structures are unremarkable in appearance.  The orbits are within normal limits. The paranasal sinuses and mastoid air cells are well-aerated.  No significant soft tissue abnormalities are seen.  IMPRESSION:  1.  No definite acute intracranial pathology seen on CT. 2.  Interval evolution of chronic encephalomalacia at the left frontal lobe; increased attenuation along the gyri is more apparent, without evidence of recurrent bleed.  No  definite underlying mass seen.  This reflects the prior hemorrhagic stroke in this region.  Original Report Authenticated By: Tonia Ghent, M.D.    Medications: I have reviewed the patient's current medications.  Assessment/Plan:  1. Seizure  Likely secondary to prior stroke, encephalomalacia. EEG pending. Continue with Dilantin.  CT negative for worsening hemorrhage.   2. CAD  Continue with crestor, atenolol.   3. DM2  Continue with SSI.   4. HTN  Continue with atenolol.  5. Atrial fibrillation Continue with atenolol.  Continue with baby aspirin due to prior history of stroke.    LOS: 0 days   Meilyn Heindl M.D.  [facility] 01/29/2011, 2:44 PM

## 2011-01-29 NOTE — Progress Notes (Signed)
Nursing: On-Call MD paged upon patient's arrival to unit. Patient is A&Ox3, disoriented to time and has some memory deficits. Patient is lying in bed, sleeping, easily arousable, states no pain. Admission History obtained, awaiting call back from MD. Bed alarm is on, call bell placed in patient's reach. Will continue to monitor.

## 2011-01-29 NOTE — Procedures (Signed)
EEG NUMBER:  REFERRING PHYSICIAN:  Isidor Holts, MD  HISTORY:  An 77 year old male with new onset seizures.  MEDICATIONS:  NovoLog, Dilantin.  CONDITIONS OF RECORDING:  This is a 16-channel EEG carried out with the patient in the awake and confused state.  DESCRIPTION:  The waking background activity is marred by muscle and movement artifact.  When the background rhythm can be evaluated, the posterior background rhythm consists of a low-voltage, symmetrical, fairly well-organized 8 Hz alpha activity seen from the parieto- occipital and posterotemporal regions.  Low-voltage, fast activity, poorly organized was seen anteriorly at times, and superimposed on more posterior rhythms.  A mixture of theta and alpha was seen from the central and temporal regions.  The patient does not drowse or sleep. Stage II sleep was not obtained.  Hypoventilation was not performed. Intermittent photic stimulation failed to elicit any change in the tracing.  IMPRESSION:  This EEG is suboptimal secondary to the predominance of muscle and movement artifact.  The portion of the tracing that can be evaluated though was normal.  No epileptiform activity was noted.  COMMENT:  Subtle abnormalities cannot be appreciated secondary to the predominance of muscle and movement artifact.          ______________________________ Thana Farr, MD    NW:GNFA D:  01/29/2011 17:30:00  T:  01/29/2011 20:22:47  Job #:  [id number]

## 2011-01-29 NOTE — ED Provider Notes (Signed)
History     CSN: [id number]  Arrival date & time 01/29/11  2440    Chief Complaint  Patient presents with  . Seizures    Patient is a 77 y.o. male presenting with seizures. The history is provided by the spouse and a friend. History Limited By: poor historian.  Seizures   Pt was seen at 0105.  Per pt's wife, c/o sudden onset and resolution of one episode of pt "shaking" PTA.  Pt's wife states she woke up to him "shaking" and "breathing different" while he was laying face down on his pillow and bedsheets. Denies apnea. Pt wife called their neighbor to help her turn pt over and he had "blood on his forehead from where he scratched it."  EMS noted pt to be lethargic on their arrival to scene, but became more alert/awake during transport to ED.  Pt's wife states pt has been "using the wrong words when he talks" (ie: told his wife the christmas wreath was a chandelier) for the past 3-4 days, which has been gradually worsening.  No slurred speech, no facial droop, no focal motor weakness, no ataxia, no fevers, no N/V/D, no reported falls/head injuries.  Pt currently is confused re: events PTA.      Past Medical History  Diagnosis Date  . Diabetes mellitus     type II  . Atrial fibrillation     stopped coumadin due to cerebral hem.  . Hypertension     intolerance to ace  . Anemia   . Arthritis     osteoarthritis  . Cataract   . Macular degeneration   . Disc degeneration, lumbar   . Cerebral hemorrhage 2011    amyloid angiopathy with cerebral hemorrhage  . CAP (community acquired pneumonia) 11/2010    RUL    Past Surgical History  Procedure Date  . Left ear surgery   . Hernia repair     as a child  . Colon surgery     bowel obstruction surgery  . Appendectomy   . Tonsillectomy   . Coronary angioplasty     CAD//2D ECHO - mild LVF and dilated EF 35-45%(12/2003)//cardiolite abn (07/2005)  . Carotid screen 10/2002    mild stenosis, neg AAA screen, ABI's normal  . Colonoscopy  12/2005    polyp, tics  . Ct brain 10/2007    small subdural hygromas-- no acute change  . 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 Pacemaker insertion     Family History  Problem Relation Age of Onset  . Diabetes Mother   . Cancer Mother     stomach  . Diabetes Father   . Diabetes Sister   . Diabetes Brother     History  Substance Use Topics  . Smoking status: Former Games developer  . Smokeless tobacco: Not on file  . Alcohol Use: Yes     Rarely      Review of Systems  Unable to perform ROS: Other  Neurological: Positive for seizures.    Allergies  Lisinopril; Penicillins; Rofecoxib; Rosiglitazone maleate; and Vancomycin  Home Medications   Current Outpatient Rx  Name Route Sig Dispense Refill  . ASPIRIN 81 MG PO TABS Oral Take 81 mg by mouth daily.      . ATENOLOL 50 MG PO TABS  TAKE 1/2 TABLET BY MOUTH ONCE DAILY 45 tablet 2  . OCUVITE PO TABS Oral Take 1 tablet by mouth daily.      Marland Kitchen CARBONYL IRON  45 MG PO TABS Oral Take 45 mg by mouth daily.      Marland Kitchen VITAMIN D 1000 UNITS PO TABS  Take two tablets by mouth daily     . GLUCOSAMINE 1500 COMPLEX PO  2 every day by mouth     . MELOXICAM 7.5 MG PO TABS Oral Take 1 tablet (7.5 mg total) by mouth daily. 30 tablet 3  . METFORMIN HCL 500 MG PO TABS Oral Take 500 mg by mouth 2 (two) times daily with a meal.      . CEREFOLIN NAC 6-2-600 MG PO TABS  Take 1 tablet by mouth once a day     . OCUVITE EXTRA PO TABS  As directed     . FISH OIL 1000 MG PO CAPS Oral Take 1 capsule by mouth 2 (two) times daily.     Marland Kitchen ROSUVASTATIN CALCIUM 10 MG PO TABS Oral Take 10 mg by mouth daily.      Marland Kitchen TAMSULOSIN HCL 0.4 MG PO CAPS Oral Take 0.4 mg by mouth daily.      Marland Kitchen VITAMIN B-12 1000 MCG PO TABS Oral Take 1,000 mcg by mouth daily.      . MULTIVITAMIN PO  Take 1 daily       BP 131/72  Pulse 72  Temp(Src) 98.8 F (37.1 C) (Rectal)  Resp 9  SpO2 94%  Physical Exam 0110: Physical examination:  Nursing notes  reviewed; Vital signs and O2 SAT reviewed;  Constitutional: Well developed, Well nourished, In no acute distress; Head:  Normocephalic, +small linear superficial abrasion to forehead/hemostatic; Eyes: EOMI, PERRL, No scleral icterus; ENMT: Mouth and pharynx normal, Mucous membranes dry; Neck: Supple, Full range of motion, No lymphadenopathy; Cardiovascular: Regular rate and rhythm, No murmur or gallop; Respiratory: Breath sounds clear & equal bilaterally, No rales, rhonchi, wheezes, Normal respiratory effort/excursion; Chest: Nontender, Movement normal; Abdomen: Soft, Nontender, Nondistended, Normal bowel sounds; Extremities: Pulses normal, No tenderness, No edema, No calf edema or asymmetry.; Neuro: Awake/alert, confused re: events, +HOH but otherwise major CN grossly intact. No facial droop, speech clear.  Grips equal, strength 5/5 equal bilat UE's and LE's, no drift x4 extremities.; Skin: Color normal, Warm, Dry, no rash.    ED Course  Procedures    MDM  MDM Reviewed: nursing note, vitals and previous chart Reviewed previous: ECG Interpretation: ECG, labs, x-ray and CT scan    Date: 01/29/2011  Rate: 89  Rhythm: atrial fibrillation and premature ventricular contractions (PVC)  QRS Axis: left  Intervals: normal  ST/T Wave abnormalities: nonspecific ST/T changes  Conduction Disutrbances:nonspecific intraventricular conduction delay  Narrative Interpretation:   Old EKG Reviewed: changes noted; previous EKG dated 09/09/2009 with ventricular-paced rhythm.  Results for orders placed during the hospital encounter of 01/29/11  CBC      Component Value Range   WBC 7.8  4.0 - 10.5 (K/uL)   RBC 4.13 (*) 4.22 - 5.81 (MIL/uL)   Hemoglobin 13.0  13.0 - 17.0 (g/dL)   HCT 40.9 (*) 81.1 - 52.0 (%)   MCV 91.5  78.0 - 100.0 (fL)   MCH 31.5  26.0 - 34.0 (pg)   MCHC 34.4  30.0 - 36.0 (g/dL)   RDW 91.4  78.2 - 95.6 (%)   Platelets 104 (*) 150 - 400 (K/uL)  DIFFERENTIAL      Component Value Range    Neutrophils Relative 74  43 - 77 (%)   Lymphocytes Relative 16  12 - 46 (%)   Monocytes Relative 8  3 - 12 (%)   Eosinophils Relative 2  0 - 5 (%)   Basophils Relative 0  0 - 1 (%)   Neutro Abs 5.8  1.7 - 7.7 (K/uL)   Lymphs Abs 1.2  0.7 - 4.0 (K/uL)   Monocytes Absolute 0.6  0.1 - 1.0 (K/uL)   Eosinophils Absolute 0.2  0.0 - 0.7 (K/uL)   Basophils Absolute 0.0  0.0 - 0.1 (K/uL)  COMPREHENSIVE METABOLIC PANEL      Component Value Range   Sodium 135  135 - 145 (mEq/L)   Potassium 3.7  3.5 - 5.1 (mEq/L)   Chloride 99  96 - 112 (mEq/L)   CO2 25  19 - 32 (mEq/L)   Glucose, Bld 206 (*) 70 - 99 (mg/dL)   BUN 15  6 - 23 (mg/dL)   Creatinine, Ser 1.61  0.50 - 1.35 (mg/dL)   Calcium 9.8  8.4 - 09.6 (mg/dL)   Total Protein 6.8  6.0 - 8.3 (g/dL)   Albumin 3.8  3.5 - 5.2 (g/dL)   AST 18  0 - 37 (U/L)   ALT 14  0 - 53 (U/L)   Alkaline Phosphatase 90  39 - 117 (U/L)   Total Bilirubin 0.6  0.3 - 1.2 (mg/dL)   GFR calc non Af Amer 87 (*) >90 (mL/min)   GFR calc Af Amer >90  >90 (mL/min)  LACTIC ACID, PLASMA      Component Value Range   Lactic Acid, Venous 2.8 (*) 0.5 - 2.2 (mmol/L)  URINALYSIS, ROUTINE W REFLEX MICROSCOPIC      Component Value Range   Color, Urine YELLOW  YELLOW    APPearance CLEAR  CLEAR    Specific Gravity, Urine 1.016  1.005 - 1.030    pH 5.5  5.0 - 8.0    Glucose, UA NEGATIVE  NEGATIVE (mg/dL)   Hgb urine dipstick TRACE (*) NEGATIVE    Bilirubin Urine NEGATIVE  NEGATIVE    Ketones, ur 15 (*) NEGATIVE (mg/dL)   Protein, ur 30 (*) NEGATIVE (mg/dL)   Urobilinogen, UA 1.0  0.0 - 1.0 (mg/dL)   Nitrite NEGATIVE  NEGATIVE    Leukocytes, UA NEGATIVE  NEGATIVE   POCT I-STAT TROPONIN I      Component Value Range   Troponin i, poc 0.00  0.00 - 0.08 (ng/mL)   Comment 3           POCT I-STAT 3, BLOOD GAS (G3+)      Component Value Range   pH, Arterial 7.404  7.350 - 7.450    pCO2 arterial 41.5  35.0 - 45.0 (mmHg)   pO2, Arterial 62.0 (*) 80.0 - 100.0 (mmHg)   Bicarbonate  26.0 (*) 20.0 - 24.0 (mEq/L)   TCO2 27  0 - 100 (mmol/L)   O2 Saturation 92.0     Acid-Base Excess 1.0  0.0 - 2.0 (mmol/L)   Patient temperature 98.6 F     Collection site RADIAL, ALLEN'S TEST ACCEPTABLE     Drawn by RT     Sample type ARTERIAL    URINE MICROSCOPIC-ADD ON      Component Value Range   WBC, UA 0-2  <3 (WBC/hpf)   RBC / HPF 0-2  <3 (RBC/hpf)     Dg Chest 2 View 01/29/2011  *RADIOLOGY REPORT*  Clinical Data: Possible seizure.  CHEST - 2 VIEW  Comparison: Chest radiograph performed 12/04/2010  Findings: The lungs are well-aerated.  Vascular congestion is noted.  Mild bibasilar opacities may reflect  atelectasis or possibly pneumonia.  Alternatively, mild interstitial edema could have a similar appearance.  There is no evidence of pleural effusion or pneumothorax.  The heart is enlarged.  A pacemaker is noted at the right chest wall, with leads ending at the right atrium and right ventricle. No acute osseous abnormalities are seen.  IMPRESSION: Mild bibasilar airspace opacities may reflect atelectasis or possibly mild pneumonia.  Alternatively, given vascular congestion and cardiomegaly, mild interstitial edema could have a similar appearance.  Original Report Authenticated By: Tonia Ghent, M.D.   Ct Head Wo Contrast 01/29/2011  *RADIOLOGY REPORT*  Clinical Data: Seizure; abrasion to the forehead.  CT HEAD WITHOUT CONTRAST  Technique:  Contiguous axial images were obtained from the base of the skull through the vertex without contrast.  Comparison: Multiple prior CTs of the head, the most recent of which was performed 11/11/2009  Findings: There is no evidence of acute infarction, mass lesion, or intra- or extra-axial hemorrhage on CT.  Chronic encephalomalacia is noted at the left frontal lobe, with a pattern of increased attenuation peripherally along the gyral folds.  This is more apparent than on the prior CT, and likely reflects interval evolution of previously noted  encephalomalacia due to a hemorrhagic stroke.  There is no evidence of recurrent bleed.  No significant mass effect or midline shift is seen.  There is minimal ex vacuo dilatation of the frontal horn of the left lateral ventricle.  The posterior fossa, including the cerebellum, brainstem and fourth ventricle, is within normal limits.  The basal ganglia are unremarkable in appearance.  There is no evidence of fracture; visualized osseous structures are unremarkable in appearance.  The orbits are within normal limits. The paranasal sinuses and mastoid air cells are well-aerated.  No significant soft tissue abnormalities are seen.  IMPRESSION:  1.  No definite acute intracranial pathology seen on CT. 2.  Interval evolution of chronic encephalomalacia at the left frontal lobe; increased attenuation along the gyri is more apparent, without evidence of recurrent bleed.  No definite underlying mass seen.  This reflects the prior hemorrhagic stroke in this region.  Original Report Authenticated By: Tonia Ghent, M.D.     Brucie.Situ:  Called into pt's room by family and ED RN.  Pt with generalized tonic clonic seizure lasting approx <1 min before spontaneously resolving.  Pt lethargic/post-ictal, resps without distress.  Will load IV dilantin.  Pt's wife states "that's what he did at home" PTA.  Dx testing d/w pt's wife and neighbor.  Questions answered.  Verb understanding, agreeable to admit.  0425:  T/C to [facility] Dr. Lovell Sheehan, case discussed, including:  HPI, pertinent PM/SHx, VS/PE, dx testing, ED course and treatment.  Agreeable to admit.  Requests to write temporary orders, neurotele bed to team 1/Dr. Sunnie Nielsen.        Cecil Bixby Allison Quarry, DO 01/31/11 [id number]

## 2011-01-29 NOTE — Progress Notes (Signed)
*  PRELIMINARY RESULTS* EEG has been performed.  Tony Camacho 01/29/2011, 1:59 PM

## 2011-01-29 NOTE — H&P (Addendum)
DATE OF ADMISSION:  01/29/2011  PCP:  Roxy Manns, MD, MD  CARDs:  Dr. Viann Fish NEURO: Dr. Pearlean Brownie  Chief Complaint: face down in bed shaking all over   HPI: Tony Camacho is an 77 y.o. male who was noticed by wife after both had been sleeping, she noticed he was face down and shaking all over. This was around 11 pm PTA.  His wife tried to turn him over and had to go get help to turn him over, and EMS was called, and patient was described by EMS as being postictal like.  EMS also checked his blood sugar and on the scene his blood sugar was found to be 180.  In the ED patient was witnessed  By the EDP and staff and family members with another episode of shaking all over.  Afterward he was confused.  He was administered a loading dose of Dilantin and referred for medical admission.  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  This past week patient was noticed by wife as having episodes of suing the wrong words but his speech was clear.  An example the family gave was that instead of saying pen he would say candle instead of pen.  He was not noted to have had any complaints of fevers, headache, chills, nausea, vomiting, chest pain or SOB.  Patient's wife does report that for years he has snored and had episodes of not breathing.  He has not had any appetite loss.    Past Medical History  Diagnosis Date  . Diabetes mellitus     type II  . Atrial fibrillation     stopped coumadin due to cerebral hem.  . Hypertension     intolerance to ace  . Anemia   . Arthritis     osteoarthritis  . Cataract   . Macular degeneration   . Disc degeneration, lumbar   . Cerebral hemorrhage 2011    amyloid angiopathy with cerebral hemorrhage  . CAP (community acquired pneumonia) 11/2010    RUL    Past Surgical History  Procedure Date  . Left ear surgery   . Hernia repair     as a child    . Colon surgery     bowel obstruction surgery  . Appendectomy   . Tonsillectomy   . Coronary angioplasty     CAD//2D ECHO - mild LVF and dilated EF 35-45%(12/2003)//cardiolite abn (07/2005)  . Carotid screen 10/2002    mild stenosis, neg AAA screen, ABI's normal  . Colonoscopy 12/2005    polyp, tics  . Ct brain 10/2007    small subdural hygromas-- no acute change  . 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 Pacemaker insertion     Medications:  HOME MEDS: Prior to Admission medications   Medication Sig Start Date End Date Taking? Authorizing Provider  aspirin 81 MG tablet Take 81 mg by mouth daily.     Yes Historical Provider, MD  atenolol (TENORMIN) 50 MG tablet TAKE 1/2 TABLET BY MOUTH ONCE DAILY 01/15/11  Yes Roxy Manns, MD  beta carotene w/minerals (OCUVITE) tablet Take 1 tablet by mouth daily.     Yes Historical Provider, MD  carbonyl iron ([facility] IRON) 45 MG TABS Take 45 mg by mouth daily.     Yes Historical Provider, MD  cholecalciferol (VITAMIN D) 1000 UNITS tablet  Take two tablets by mouth daily    Yes Historical Provider, MD  Glucosamine-Chondroit-Vit C-Mn (GLUCOSAMINE 1500 COMPLEX PO) 2 every day by mouth    Yes Historical Provider, MD  meloxicam (MOBIC) 7.5 MG tablet Take 1 tablet (7.5 mg total) by mouth daily. 01/08/11 01/08/12 Yes Spencer Copland, MD  metFORMIN (GLUCOPHAGE) 500 MG tablet Take 500 mg by mouth 2 (two) times daily with a meal.     Yes Historical Provider, MD  Methylfol-Methylcob-Acetylcyst (CEREFOLIN NAC) 6-2-600 MG TABS Take 1 tablet by mouth once a day    Yes Historical Provider, MD  Multiple Vitamins-Minerals (OCUVITE EXTRA) TABS As directed    Yes Historical Provider, MD  Omega-3 Fatty Acids (FISH OIL) 1000 MG CAPS Take 1 capsule by mouth 2 (two) times daily.    Yes Historical Provider, MD  rosuvastatin (CRESTOR) 10 MG tablet Take 10 mg by mouth daily.     Yes Historical Provider, MD  Tamsulosin HCl (FLOMAX) 0.4 MG CAPS  Take 0.4 mg by mouth daily.     Yes Historical Provider, MD  vitamin B-12 (CYANOCOBALAMIN) 1000 MCG tablet Take 1,000 mcg by mouth daily.     Yes Historical Provider, MD  Multiple Vitamin (MULTIVITAMIN PO) Take 1 daily     Historical Provider, MD    Allergies:  Allergies  Allergen Reactions  . Lisinopril     REACTION: hypotension  . Penicillins     REACTION: ?  . Rofecoxib     REACTION: edema  . Rosiglitazone Maleate     REACTION: swelling  . Vancomycin     ?? Question reaction to this (said vanemycin in file at home)    Social History:   reports that he has quit smoking. He does not have any smokeless tobacco history on file. He reports that he drinks alcohol. He reports that he does not use illicit drugs.  Family History: Family History  Problem Relation Age of Onset  . Diabetes Mother   . Cancer Mother     stomach  . Diabetes Father   . Diabetes Sister   . Diabetes Brother     Review of Systems:  The patient denies anorexia, fever, weight loss,, vision loss, decreased hearing, hoarseness, chest pain, syncope, dyspnea on exertion, peripheral edema, balance deficits, hemoptysis, abdominal pain, melena, hematochezia, severe indigestion/heartburn, hematuria, incontinence, genital sores, muscle weakness, suspicious skin lesions, transient blindness, difficulty walking, depression, unusual weight change, abnormal bleeding, enlarged lymph nodes, angioedema, and breast masses.   Physical Exam:  GEN:  Pleasant elderly mildy confused  77 year old well nourished and well developed male  examined  and in no acute distress;  Filed Vitals:   01/29/11 0025 01/29/11 0041 01/29/11 0051 01/29/11 0355  BP:  131/72  149/62  Pulse:  72  74  Temp:  98.1 F (36.7 C) 98.8 F (37.1 C)   TempSrc:  Oral Rectal   Resp:  9  13  SpO2: 95% 94%  96%   Blood pressure 149/62, pulse 74, temperature 98.8 F (37.1 C), temperature source Rectal, resp. rate 13, SpO2 96.00%. PSYCH: He is alert and  oriented x1; does not appear anxious does not appear depressed; affect is normal HEENT: Normocephalic and Atraumatic except for a small excoriation on mid frontal area, Mucous membranes pink; PERRLA; EOM intact; Fundi:  Benign;  No scleral icterus, Nares: Patent, Oropharynx: +left lateral Tongue area laceration present, No exudates,  Neck:  FROM, no cervical lymphadenopathy nor thyromegaly or carotid bruit; no JVD; Breasts:: Not examined  CHEST WALL: No tenderness CHEST: Normal respiration, clear to auscultation bilaterally HEART: Regular rate and rhythm; no murmurs rubs or gallops BACK: No kyphosis or scoliosis; no CVA tenderness ABDOMEN: Positive Bowel Sounds, soft non-tender; no masses, no organomegaly. Rectal Exam: Not done EXTREMITIES: Age-appropriate arthropathy of the hands and knees; no cyanosis, clubbing or edema; no ulcerations. Genitalia: not examined PULSES: 2+ and symmetric SKIN: Normal hydration no rash or ulceration CNS: Cranial nerves 2-12 grossly intact no focal neurologic deficit is able to move all 4 extremities   Labs & Imaging Results for orders placed during the hospital encounter of 01/29/11 (from the past 48 hour(s))  CBC     Status: Abnormal   Collection Time   01/29/11  1:11 AM      Component Value Range Comment   WBC 7.8  4.0 - 10.5 (K/uL)    RBC 4.13 (*) 4.22 - 5.81 (MIL/uL)    Hemoglobin 13.0  13.0 - 17.0 (g/dL)    HCT 19.1 (*) 47.8 - 52.0 (%)    MCV 91.5  78.0 - 100.0 (fL)    MCH 31.5  26.0 - 34.0 (pg)    MCHC 34.4  30.0 - 36.0 (g/dL)    RDW 29.5  62.1 - 30.8 (%)    Platelets 104 (*) 150 - 400 (K/uL) PLATELET COUNT CONFIRMED BY SMEAR  DIFFERENTIAL     Status: Normal   Collection Time   01/29/11  1:11 AM      Component Value Range Comment   Neutrophils Relative 74  43 - 77 (%)    Lymphocytes Relative 16  12 - 46 (%)    Monocytes Relative 8  3 - 12 (%)    Eosinophils Relative 2  0 - 5 (%)    Basophils Relative 0  0 - 1 (%)    Neutro Abs 5.8  1.7 - 7.7  (K/uL)    Lymphs Abs 1.2  0.7 - 4.0 (K/uL)    Monocytes Absolute 0.6  0.1 - 1.0 (K/uL)    Eosinophils Absolute 0.2  0.0 - 0.7 (K/uL)    Basophils Absolute 0.0  0.0 - 0.1 (K/uL)   COMPREHENSIVE METABOLIC PANEL     Status: Abnormal   Collection Time   01/29/11  1:11 AM      Component Value Range Comment   Sodium 135  135 - 145 (mEq/L)    Potassium 3.7  3.5 - 5.1 (mEq/L)    Chloride 99  96 - 112 (mEq/L)    CO2 25  19 - 32 (mEq/L)    Glucose, Bld 206 (*) 70 - 99 (mg/dL)    BUN 15  6 - 23 (mg/dL)    Creatinine, Ser 6.57  0.50 - 1.35 (mg/dL)    Calcium 9.8  8.4 - 10.5 (mg/dL)    Total Protein 6.8  6.0 - 8.3 (g/dL)    Albumin 3.8  3.5 - 5.2 (g/dL)    AST 18  0 - 37 (U/L)    ALT 14  0 - 53 (U/L)    Alkaline Phosphatase 90  39 - 117 (U/L)    Total Bilirubin 0.6  0.3 - 1.2 (mg/dL)    GFR calc non Af Amer 87 (*) >90 (mL/min)    GFR calc Af Amer >90  >90 (mL/min)   LACTIC ACID, PLASMA     Status: Abnormal   Collection Time   01/29/11  1:12 AM      Component Value Range Comment   Lactic Acid, Venous 2.8 (*)  0.5 - 2.2 (mmol/L)   POCT I-STAT TROPONIN I     Status: Normal   Collection Time   01/29/11  1:34 AM      Component Value Range Comment   Troponin i, poc 0.00  0.00 - 0.08 (ng/mL)    Comment 3            POCT I-STAT 3, BLOOD GAS (G3+)     Status: Abnormal   Collection Time   01/29/11  1:50 AM      Component Value Range Comment   pH, Arterial 7.404  7.350 - 7.450     pCO2 arterial 41.5  35.0 - 45.0 (mmHg)    pO2, Arterial 62.0 (*) 80.0 - 100.0 (mmHg)    Bicarbonate 26.0 (*) 20.0 - 24.0 (mEq/L)    TCO2 27  0 - 100 (mmol/L)    O2 Saturation 92.0      Acid-Base Excess 1.0  0.0 - 2.0 (mmol/L)    Patient temperature 98.6 F      Collection site RADIAL, ALLEN'S TEST ACCEPTABLE      Drawn by RT      Sample type ARTERIAL     URINALYSIS, ROUTINE W REFLEX MICROSCOPIC     Status: Abnormal   Collection Time   01/29/11  2:00 AM      Component Value Range Comment   Color, Urine YELLOW   YELLOW     APPearance CLEAR  CLEAR     Specific Gravity, Urine 1.016  1.005 - 1.030     pH 5.5  5.0 - 8.0     Glucose, UA NEGATIVE  NEGATIVE (mg/dL)    Hgb urine dipstick TRACE (*) NEGATIVE     Bilirubin Urine NEGATIVE  NEGATIVE     Ketones, ur 15 (*) NEGATIVE (mg/dL)    Protein, ur 30 (*) NEGATIVE (mg/dL)    Urobilinogen, UA 1.0  0.0 - 1.0 (mg/dL)    Nitrite NEGATIVE  NEGATIVE     Leukocytes, UA NEGATIVE  NEGATIVE    URINE MICROSCOPIC-ADD ON     Status: Normal   Collection Time   01/29/11  2:00 AM      Component Value Range Comment   WBC, UA 0-2  <3 (WBC/hpf)    RBC / HPF 0-2  <3 (RBC/hpf)    Dg Chest 2 View  01/29/2011  *RADIOLOGY REPORT*  Clinical Data: Possible seizure.  CHEST - 2 VIEW  Comparison: Chest radiograph performed 12/04/2010  Findings: The lungs are well-aerated.  Vascular congestion is noted.  Mild bibasilar opacities may reflect atelectasis or possibly pneumonia.  Alternatively, mild interstitial edema could have a similar appearance.  There is no evidence of pleural effusion or pneumothorax.  The heart is enlarged.  A pacemaker is noted at the right chest wall, with leads ending at the right atrium and right ventricle. No acute osseous abnormalities are seen.  IMPRESSION: Mild bibasilar airspace opacities may reflect atelectasis or possibly mild pneumonia.  Alternatively, given vascular congestion and cardiomegaly, mild interstitial edema could have a similar appearance.  Original Report Authenticated By: Tonia Ghent, M.D.   Ct Head Wo Contrast  01/29/2011  *RADIOLOGY REPORT*  Clinical Data: Seizure; abrasion to the forehead.  CT HEAD WITHOUT CONTRAST  Technique:  Contiguous axial images were obtained from the base of the skull through the vertex without contrast.  Comparison: Multiple prior CTs of the head, the most recent of which was performed 11/11/2009  Findings: There is no evidence of acute infarction, mass lesion, or  intra- or extra-axial hemorrhage on CT.   Chronic encephalomalacia is noted at the left frontal lobe, with a pattern of increased attenuation peripherally along the gyral folds.  This is more apparent than on the prior CT, and likely reflects interval evolution of previously noted encephalomalacia due to a hemorrhagic stroke.  There is no evidence of recurrent bleed.  No significant mass effect or midline shift is seen.  There is minimal ex vacuo dilatation of the frontal horn of the left lateral ventricle.  The posterior fossa, including the cerebellum, brainstem and fourth ventricle, is within normal limits.  The basal ganglia are unremarkable in appearance.  There is no evidence of fracture; visualized osseous structures are unremarkable in appearance.  The orbits are within normal limits. The paranasal sinuses and mastoid air cells are well-aerated.  No significant soft tissue abnormalities are seen.  IMPRESSION:  1.  No definite acute intracranial pathology seen on CT. 2.  Interval evolution of chronic encephalomalacia at the left frontal lobe; increased attenuation along the gyri is more apparent, without evidence of recurrent bleed.  No definite underlying mass seen.  This reflects the prior hemorrhagic stroke in this region.  Original Report Authenticated By: Tonia Ghent, M.D.      Assessment/Plan: 1.  Seizure 2.  CAD 3.  DM2 4.  HTN 5.  Atrial fibrillation   Plan:     Admitted to a Telemetry Unit for cardiac monitoring and cardiac enzymes have been ordered.  Seizure Precautions and Neuro checks have also been ordered.  IV Dilantin therapy will continue with PRN Ativan for Seizure activity . A seizure workup has been initiated, and An EEG study has been ordered. Continuous pulse oximetry has been ordered to monitor patient oxygen level and PRN Oxygen as well.  Patient will need to have further evaluation for Sleep Apnea.  His regular medications have been reconciled, and Sliding scale insulin has been ordered for elevated blood  sugars.  Other plans as per orders.    CODE STATUS:      FULL CODE         Yadira Hada C 01/29/2011, 4:49 AM

## 2011-01-30 LAB — CBC
Platelets: 103 10*3/uL — ABNORMAL LOW (ref 150–400)
Platelets: 105 10*3/uL — ABNORMAL LOW (ref 150–400)
RBC: 4.06 MIL/uL — ABNORMAL LOW (ref 4.22–5.81)
RBC: 4.24 MIL/uL (ref 4.22–5.81)
RDW: 13.5 % (ref 11.5–15.5)
WBC: 8.7 10*3/uL (ref 4.0–10.5)
WBC: 9.3 10*3/uL (ref 4.0–10.5)

## 2011-01-30 LAB — BASIC METABOLIC PANEL
CO2: 25 mEq/L (ref 19–32)
Calcium: 9.3 mg/dL (ref 8.4–10.5)
Chloride: 104 mEq/L (ref 96–112)
Chloride: 106 mEq/L (ref 96–112)
Creatinine, Ser: 0.81 mg/dL (ref 0.50–1.35)
GFR calc Af Amer: >90 mL/min (ref >90)
Sodium: 140 mEq/L (ref 135–145)
Sodium: 141 mEq/L (ref 135–145)

## 2011-01-30 LAB — GLUCOSE, CAPILLARY
Glucose-Capillary: 142 mg/dL — ABNORMAL HIGH (ref 70–99)
Glucose-Capillary: 119 mg/dL — ABNORMAL HIGH (ref 70–99)
Glucose-Capillary: 224 mg/dL — ABNORMAL HIGH (ref 70–99)

## 2011-01-30 LAB — URINE CULTURE

## 2011-01-30 LAB — PHENYTOIN LEVEL, TOTAL: Phenytoin Lvl: 12.1 ug/mL (ref 10.0–20.0)

## 2011-01-30 MED ORDER — INSULIN ASPART 100 UNIT/ML ~~LOC~~ SOLN
0.0000 [IU] | Freq: Three times a day (TID) | SUBCUTANEOUS | Status: DC
  Administered 2011-01-30: 2 [IU] via SUBCUTANEOUS
  Administered 2011-01-30: 3 [IU] via SUBCUTANEOUS
  Administered 2011-01-31 – 2011-02-01 (×3): 2 [IU] via SUBCUTANEOUS
  Administered 2011-02-01 – 2011-02-02 (×3): 1 [IU] via SUBCUTANEOUS

## 2011-01-30 NOTE — Progress Notes (Signed)
Subjective: awake, following command, more confuse per wife. Objective: Filed Vitals:   01/29/11 2200 01/30/11 0218 01/30/11 0545 01/30/11 1000  BP: 127/77 148/74 138/81 144/81  Pulse: 72 73 69 70  Temp: 98.2 F (36.8 C) 97.9 F (36.6 C) 98.3 F (36.8 C) 98.6 F (37 C)  TempSrc:  Oral Oral Oral  Resp: 18 20 18 18   Height:      Weight:      SpO2: 90% 96% 98% 92%   Weight change:   Intake/Output Summary (Last 24 hours) at 01/30/11 1319 Last data filed at 01/30/11 1610  Gross per 24 hour  Intake      2 ml  Output    300 ml  Net   -298 ml    General: Alert, awake, oriented x3, in no acute distress.  HEENT: No bruits, no goiter.  Heart: Regular rate and rhythm, without murmurs, rubs, gallops.  Lungs: CTA, bilateral air movement.  Abdomen: Soft, nontender, nondistended, positive bowel sounds.  Neuro: Grossly intact, nonfocal. Extremities; no edema.   Lab Results:  [facility] 01/30/11 0500 01/29/11 2351  NA 140 141  K 3.5 3.4*  CL 104 106  CO2 25 25  GLUCOSE 132* 137*  BUN 19 19  CREATININE 0.80 0.81  CALCIUM 9.6 9.3  MG -- --  PHOS -- --    Basename 01/29/11 0111  AST 18  ALT 14  ALKPHOS 90  BILITOT 0.6  PROT 6.8  ALBUMIN 3.8    Basename 01/30/11 0500 01/29/11 2351 01/29/11 0111  WBC 8.7 9.3 --  NEUTROABS -- -- 5.8  HGB 13.4 12.7* --  HCT 39.3 37.2* --  MCV 92.7 91.6 --  PLT 105* 103* --    Basename 01/29/11 2203 01/29/11 1411 01/29/11 0746  CKTOTAL 1031* 939* 460*  CKMB 15.3* 19.9* 13.3*  CKMBINDEX -- -- --  TROPONINI <0.30 <0.30 <0.30    Basename 01/29/11 0747  HGBA1C 7.6*    Micro Results: Recent Results (from the past 240 hour(s))  URINE CULTURE     Status: Normal   Collection Time   01/29/11  2:00 AM      Component Value Range Status Comment   Specimen Description URINE, RANDOM   Final    Special Requests NONE   Final    Setup Time [id number]   Final    Colony Count NO GROWTH   Final    Culture NO GROWTH   Final    Report  Status 01/30/2011 FINAL   Final     Studies/Results: Dg Chest 2 View  01/29/2011  *RADIOLOGY REPORT*  Clinical Data: Possible seizure.  CHEST - 2 VIEW  Comparison: Chest radiograph performed 12/04/2010  Findings: The lungs are well-aerated.  Vascular congestion is noted.  Mild bibasilar opacities may reflect atelectasis or possibly pneumonia.  Alternatively, mild interstitial edema could have a similar appearance.  There is no evidence of pleural effusion or pneumothorax.  The heart is enlarged.  A pacemaker is noted at the right chest wall, with leads ending at the right atrium and right ventricle. No acute osseous abnormalities are seen.  IMPRESSION: Mild bibasilar airspace opacities may reflect atelectasis or possibly mild pneumonia.  Alternatively, given vascular congestion and cardiomegaly, mild interstitial edema could have a similar appearance.  Original Report Authenticated By: Tonia Ghent, M.D.   Ct Head Wo Contrast  01/29/2011  *RADIOLOGY REPORT*  Clinical Data: Seizure; abrasion to the forehead.  CT HEAD WITHOUT CONTRAST  Technique:  Contiguous axial images were obtained from  the base of the skull through the vertex without contrast.  Comparison: Multiple prior CTs of the head, the most recent of which was performed 11/11/2009  Findings: There is no evidence of acute infarction, mass lesion, or intra- or extra-axial hemorrhage on CT.  Chronic encephalomalacia is noted at the left frontal lobe, with a pattern of increased attenuation peripherally along the gyral folds.  This is more apparent than on the prior CT, and likely reflects interval evolution of previously noted encephalomalacia due to a hemorrhagic stroke.  There is no evidence of recurrent bleed.  No significant mass effect or midline shift is seen.  There is minimal ex vacuo dilatation of the frontal horn of the left lateral ventricle.  The posterior fossa, including the cerebellum, brainstem and fourth ventricle, is within normal  limits.  The basal ganglia are unremarkable in appearance.  There is no evidence of fracture; visualized osseous structures are unremarkable in appearance.  The orbits are within normal limits. The paranasal sinuses and mastoid air cells are well-aerated.  No significant soft tissue abnormalities are seen.  IMPRESSION:  1.  No definite acute intracranial pathology seen on CT. 2.  Interval evolution of chronic encephalomalacia at the left frontal lobe; increased attenuation along the gyri is more apparent, without evidence of recurrent bleed.  No definite underlying mass seen.  This reflects the prior hemorrhagic stroke in this region.  Original Report Authenticated By: Tonia Ghent, M.D.    Medications: I have reviewed the patient's current medications. 1. Seizure  Likely secondary to prior stroke, encephalomalacia.  EEG pending.  Continue with Dilantin.  CT negative for worsening hemorrhage.  2. CAD  Continue with crestor, atenolol.  3. DM2  Continue with SSI.  4. HTN  Continue with atenolol.  5. Atrial fibrillation  Continue with atenolol.  Continue with baby  6-Confusion: Patient more confused today. Differential: delirium, dilantin. I will check dilantin level. Unlikely worsening stroke.    Patient Active Hospital Problem List: No active hospital problems.     LOS: 1 day   Lanina Larranaga M.D.  [facility] 01/30/2011, 1:19 PM

## 2011-01-31 DIAGNOSIS — R569 Unspecified convulsions: Secondary | ICD-10-CM

## 2011-01-31 DIAGNOSIS — I6330 Cerebral infarction due to thrombosis of unspecified cerebral artery: Secondary | ICD-10-CM

## 2011-01-31 DIAGNOSIS — R269 Unspecified abnormalities of gait and mobility: Secondary | ICD-10-CM

## 2011-01-31 LAB — GLUCOSE, CAPILLARY: Glucose-Capillary: 164 mg/dL — ABNORMAL HIGH (ref 70–99)

## 2011-01-31 LAB — CBC
HCT: 39.2 % (ref 39.0–52.0)
MCHC: 34.9 g/dL (ref 30.0–36.0)
RDW: 13.8 % (ref 11.5–15.5)

## 2011-01-31 LAB — BASIC METABOLIC PANEL
BUN: 19 mg/dL (ref 6–23)
Calcium: 9.2 mg/dL (ref 8.4–10.5)
GFR calc Af Amer: >90 mL/min (ref >90)
GFR calc non Af Amer: 90 mL/min — ABNORMAL LOW (ref >90)
Potassium: 3.7 mEq/L (ref 3.5–5.1)
Sodium: 139 mEq/L (ref 135–145)

## 2011-01-31 NOTE — Consult Note (Signed)
Physical Medicine and Rehabilitation Consult Reason for Consult: Seizures Referring Phsyician: Dr. Sunnie Nielsen    HPI: Tony Camacho is an 77 y.o. 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  Patient with seizure in ED and loaded with dilantin.  EEG done by Dr. Thad Ranger and sub optimal due to muscle/motion activity, no seizure activity noted.  CCT without acute changes.  Patient has had intermittent confusion and neurology consulted for input.  PT/OT evaluations done today and patient noted to have poor safety awareness with multiple LOB. MD, PT,OT recommending CIR. Review of Systems  Eyes: Negative for blurred vision and double vision.  Respiratory: Negative for shortness of breath.   Cardiovascular: Negative for chest pain and palpitations.  Gastrointestinal: Negative for diarrhea and constipation.  Genitourinary:       Hesitancy  Musculoskeletal:       Right knee effusion drained recently ( Dr. Dallas Schimke).  Neurological: Negative for headaches.   Past Medical History  Diagnosis Date  . Diabetes mellitus     type II  . Atrial fibrillation (Dr. Donnie Aho)     stopped coumadin due to cerebral hem.  . Hypertension     intolerance to ace  . Anemia   . Arthritis     osteoarthritis  . Cataract   . Macular degeneration   . Disc degeneration, lumbar   . Cerebral hemorrhage 2011    amyloid angiopathy with cerebral hemorrhage  . CAP (community acquired pneumonia) 11/2010    RUL   Past Surgical History  Procedure Date  . Left ear surgery   . Hernia repair     as a child  . Colon surgery     bowel obstruction surgery  . Appendectomy   . Tonsillectomy   . Coronary angioplasty     CAD//2D ECHO - mild LVF and dilated EF 35-45%(12/2003)//cardiolite abn (07/2005)  . Carotid screen 10/2002    mild stenosis, neg AAA screen, ABI's normal  . Colonoscopy 12/2005    polyp, tics  . Ct brain 10/2007    small  subdural hygromas-- no acute change  . Pacemaker insertion    Family History  Problem Relation Age of Onset  . Diabetes Mother   . Cancer Mother     stomach  . Diabetes Father   . Diabetes Sister   . Diabetes Brother    Social History: Married.  Retired Engineer, production.   He reports that he has quit smoking years ago. He does not have any smokeless tobacco history on file. He reports that he drinks alcohol twice a year.  He reports that he does not use illicit drugs. Wife supportive and can provide supervision past discharge.   Allergies  Allergen Reactions  . Lisinopril     REACTION: hypotension  . Penicillins     REACTION: ?  . Rofecoxib     REACTION: edema  . Rosiglitazone Maleate     REACTION: swelling  . Vancomycin     ?? Question reaction to this (said vanemycin in file at home)    Prior to Admission medications   Medication Sig Start Date End Date Taking? Authorizing Provider  aspirin 81 MG tablet Take 81 mg by mouth daily.     Yes Historical Provider, MD  atenolol (TENORMIN) 50 MG tablet TAKE 1/2 TABLET BY MOUTH ONCE DAILY 01/15/11  Yes Roxy Manns, MD  beta carotene w/minerals (OCUVITE) tablet Take 1 tablet by mouth daily.  Yes Historical Provider, MD  carbonyl iron ([facility] IRON) 45 MG TABS Take 45 mg by mouth daily.     Yes Historical Provider, MD  cholecalciferol (VITAMIN D) 1000 UNITS tablet Take two tablets by mouth daily    Yes Historical Provider, MD  Glucosamine-Chondroit-Vit C-Mn (GLUCOSAMINE 1500 COMPLEX PO) 2 every day by mouth    Yes Historical Provider, MD  meloxicam (MOBIC) 7.5 MG tablet Take 1 tablet (7.5 mg total) by mouth daily. 01/08/11 01/08/12 Yes Spencer Copland, MD  metFORMIN (GLUCOPHAGE) 500 MG tablet Take 500 mg by mouth 2 (two) times daily with a meal.     Yes Historical Provider, MD  Methylfol-Methylcob-Acetylcyst (CEREFOLIN NAC) 6-2-600 MG TABS Take 1 tablet by mouth once a day    Yes Historical Provider, MD  Multiple Vitamins-Minerals (OCUVITE EXTRA)  TABS As directed    Yes Historical Provider, MD  Omega-3 Fatty Acids (FISH OIL) 1000 MG CAPS Take 1 capsule by mouth 2 (two) times daily.    Yes Historical Provider, MD  rosuvastatin (CRESTOR) 10 MG tablet Take 10 mg by mouth daily.     Yes Historical Provider, MD  Tamsulosin HCl (FLOMAX) 0.4 MG CAPS Take 0.4 mg by mouth daily.     Yes Historical Provider, MD  vitamin B-12 (CYANOCOBALAMIN) 1000 MCG tablet Take 1,000 mcg by mouth daily.     Yes Historical Provider, MD  Multiple Vitamin (MULTIVITAMIN PO) Take 1 daily     Historical Provider, MD   Scheduled Medications:    . antiseptic oral rinse  15 mL Mouth Rinse BID  . aspirin  81 mg Oral Daily  . atenolol  25 mg Oral Daily  . ferrous sulfate  325 mg Oral Q breakfast  . insulin aspart  0-9 Units Subcutaneous TID WC  . phenytoin (DILANTIN) IV  100 mg Intravenous Q8H  . rosuvastatin  10 mg Oral q1800  . Tamsulosin HCl  0.4 mg Oral QHS  . vitamin B-12  1,000 mcg Oral Daily   PRN MED's: acetaminophen, acetaminophen, alum & mag hydroxide-simeth, HYDROmorphone, ondansetron (ZOFRAN) IV, ondansetron, oxyCODONE, zolpidem Home: Home Living Lives With: Spouse Type of Home: House Home Layout: One level Home Access: Stairs to enter Entrance Stairs-Rails: Right;Left;Can reach both Entrance Stairs-Number of Steps: 2 Bathroom Shower/Tub: Tub/shower unit;Walk-in shower Bathroom Toilet: Standard Home Adaptive Equipment: Straight cane  Functional History: Prior Function Level of Independence: Independent with basic ADLs;Independent with gait;Other (comment) (Exercises 5 times per week) Driving: No (not since stroke 1 yr ago ) Vocation: Retired Functional Status:  Mobility: Bed Mobility Rolling Left: 6: Modified independent (Device/Increase time) Left Sidelying to Sit: 5: Supervision Transfers Sit to Stand: 4: Min assist;From bed;From toilet;From chair/3-in-1 Sit to Stand Details (indicate cue type and reason): Assist needed as patient with  multiple LOB's with sit to stand from various surfaces. VC for hand placement. Stand to Sit: 4: Min assist;To chair/3-in-1;To toilet Stand to Sit Details: VC for hand placement and assist needed to control descent Ambulation/Gait Ambulation/Gait: Yes Ambulation/Gait Assistance: 4: Min assist Ambulation/Gait Assistance Details (indicate cue type and reason): with RW no loss of balance, however pt highly distractable and with poor maneuvering of RW and does not maintain safe distance to RW; without RW pt slightly unsteady and required min assist Ambulation Distance (Feet): 150 Feet Assistive device: Rolling walker;None (see gait details) Gait Pattern: Decreased step length - right;Decreased step length - left;Right flexed knee in stance;Left flexed knee in stance Stairs: No    ADL: ADL Eating/Feeding: Simulated;Minimal assistance  Eating/Feeding Details (indicate cue type and reason): Patient with decreased dexterity, dropping utensils frequently. Wife states this is not baseline.  Where Assessed - Eating/Feeding: Chair Grooming: Performed;Teeth care;Wash/dry hands;Supervision/safety Grooming Details (indicate cue type and reason): Decreased dexterity causing for need for increased time and increased difficulty with manipulating grooming items. Where Assessed - Grooming: Standing at sink Upper Body Bathing: Not assessed Lower Body Bathing: Not assessed Upper Body Dressing: Performed;Set up;Supervision/safety Where Assessed - Upper Body Dressing: Sitting, bed Lower Body Dressing: Simulated;Minimal assistance Lower Body Dressing Details (indicate cue type and reason): sequencing and attention to task Toilet Transfer: Performed;Minimal assistance Toilet Transfer Details (indicate cue type and reason): Min assist sit to stand from toilet as patient with posterior LOB Toilet Transfer Method: Ambulating Toilet Transfer Equipment: Regular height toilet;Grab bars Toileting - Clothing  Manipulation: Performed (Min guard assist) Where Assessed - Glass blower/designer Manipulation: Standing Toileting - Hygiene: Performed;Supervision/safety Where Assessed - Toileting Hygiene: Sit on 3-in-1 or toilet Tub/Shower Transfer: Not assessed  Cognition: Cognition Arousal/Alertness: Lethargic (sleeping on arrival, minimal difficulty arousing pt) Orientation Level: Oriented to person;Disoriented to place;Disoriented to situation (pt thought he was at home (later knew he was at hospital),) Cognition Arousal/Alertness: Lethargic (sleeping on arrival, minimal difficulty arousing pt) Overall Cognitive Status: History of cognitive impairments History of Cognitive Impairment: Decline in baseline functioning Orientation Level: Oriented to person;Disoriented to place;Disoriented to situation (pt thought he was at home (later knew he was at hospital),) Safety/Judgement: Decreased awareness of safety precautions;Decreased safety judgement for tasks assessed Decreased Safety/Judgement: Decreased awareness of need for assistance Safety/Judgement - Other Comments: after losing balance several times, still thought he was safe to get up alone to go to bathroom Awareness of Deficits: Decreased awareness of deficits Cognition - Other Comments: Wife reports cognition improved today, yet still not at baseline  Blood pressure 130/79, pulse 75, temperature 97.3 F (36.3 C), temperature source Oral, resp. rate 18, height 5\' 8"  (1.727 m), weight 74.3 kg (163 lb 12.8 oz), SpO2 94.00%. Physical Exam  Constitutional: He appears well-developed and well-nourished.  HENT:  Head: Normocephalic and atraumatic.  Eyes: Pupils are equal, round, and reactive to light.  Neck: Normal range of motion. Neck supple.  Cardiovascular: Normal rate.   Pulmonary/Chest: Effort normal and breath sounds normal.  Abdominal: Soft. Bowel sounds are normal.  Neurological: He is alert. Coordination abnormal.       Oriented x 3.  Speech is slurred. It is intelligible. He follows all simple one-step commands. Strength is grossly 4/5-4+ out of 5 in all 4 limbs. No focal Sensory findings were seen. No spasticity was appreciated. Reflexes were all 2+ grossly.. Fine motor coordination was grossly intact.    Results for orders placed during the hospital encounter of 01/29/11 (from the past 24 hour(s))  GLUCOSE, CAPILLARY     Status: Abnormal   Collection Time   01/30/11  4:54 PM      Component Value Range   Glucose-Capillary 224 (*) 70 - 99 (mg/dL)  GLUCOSE, CAPILLARY     Status: Abnormal   Collection Time   01/30/11 10:14 PM      Component Value Range   Glucose-Capillary 113 (*) 70 - 99 (mg/dL)   Comment 1 Documented in Chart     Comment 2 Notify RN    CBC     Status: Abnormal   Collection Time   01/31/11  5:30 AM      Component Value Range   WBC 9.7  4.0 - 10.5 (K/uL)  RBC 4.29  4.22 - 5.81 (MIL/uL)   Hemoglobin 13.7  13.0 - 17.0 (g/dL)   HCT 16.1  09.6 - 04.5 (%)   MCV 91.4  78.0 - 100.0 (fL)   MCH 31.9  26.0 - 34.0 (pg)   MCHC 34.9  30.0 - 36.0 (g/dL)   RDW 40.9  81.1 - 91.4 (%)   Platelets 92 (*) 150 - 400 (K/uL)  BASIC METABOLIC PANEL     Status: Abnormal   Collection Time   01/31/11  5:30 AM      Component Value Range   Sodium 139  135 - 145 (mEq/L)   Potassium 3.7  3.5 - 5.1 (mEq/L)   Chloride 105  96 - 112 (mEq/L)   CO2 17 (*) 19 - 32 (mEq/L)   Glucose, Bld 111 (*) 70 - 99 (mg/dL)   BUN 19  6 - 23 (mg/dL)   Creatinine, Ser 7.82  0.50 - 1.35 (mg/dL)   Calcium 9.2  8.4 - 95.6 (mg/dL)   GFR calc non Af Amer 90 (*) >90 (mL/min)   GFR calc Af Amer >90  >90 (mL/min)  GLUCOSE, CAPILLARY     Status: Abnormal   Collection Time   01/31/11  5:50 AM      Component Value Range   Glucose-Capillary 115 (*) 70 - 99 (mg/dL)  PHENYTOIN LEVEL, TOTAL     Status: Normal   Collection Time   01/31/11  8:27 AM      Component Value Range   Phenytoin Lvl 12.4  10.0 - 20.0 (ug/mL)  GLUCOSE, CAPILLARY     Status:  Abnormal   Collection Time   01/31/11 11:47 AM      Component Value Range   Glucose-Capillary 185 (*) 70 - 99 (mg/dL)   No results found.  Assessment/Plan: Diagnosis: New onset seizures and history of hemorrhagic CVA in 2011 1. Does the need for close, 24 hr/day medical supervision in concert with the patient's rehab needs make it unreasonable for this patient to be served in a less intensive setting? No and Potentially 2. Co-Morbidities requiring supervision/potential complications: Diabetes mellitus, hypertension, atrial fibrillation 3. Due to bladder management, bowel management, safety, skin/wound care, pain management and patient education, does the patient require 24 hr/day rehab nursing? Yes 4. Does the patient require coordinated care of a physician, rehab nurse, PT (1-2 hrs/day, 5 days/week), OT (1-2 hrs/day, 5 days/week) and SLP (1-2 hrs/day, 5 days/week) to address physical and functional deficits in the context of the above medical diagnosis(es)? Potentially Addressing deficits in the following areas: balance, bathing, cognition, dressing, endurance, locomotion, psychosocial adjustment, swallowing, toileting and transferring 5. Can the patient actively participate in an intensive therapy program of at least 3 hrs of therapy per day at least 5 days per week? Yes 6. The potential for patient to make measurable gains while on inpatient rehab is fair 7. Anticipated functional outcomes upon discharge from inpatients are supervision PT, supervision OT, supervision SLP 8. Estimated rehab length of stay to reach the above functional goals is: 1 week or less if needed 9. Does the patient have adequate social supports to accommodate these discharge functional goals? Yes 10. Anticipated D/C setting: Home 11. Anticipated post D/C treatments: HH therapy 12. Overall Rehab/Functional Prognosis: good  RECOMMENDATIONS: This patient's condition is appropriate for continued rehabilitative care in  the following setting: [facility] Patient has agreed to participate in recommended program. Potentially Note that insurance prior authorization may be required for reimbursement for recommended care.  Comment: Patient's  level of cognitive function is returning closer to baseline. He's only been in the hospital for 2 days. He continues to show progress neurologically from admission. I think some more observation is in order before we commit to bring this patient to inpatient rehabilitation. Patient did have baseline memory deficits however his mobility was quite good prior to the seizure. Rehabilitation nurse to followup.    01/31/2011

## 2011-01-31 NOTE — Progress Notes (Signed)
Physical Therapy Evaluation Patient Details Name: Tony Camacho MRN: [id number] DOB: 04/21/1924 Today's Date: 01/31/2011  Problem List:  Patient Active Problem List  Diagnoses  . DIABETES MELLITUS, TYPE II  . HYPERCHOLESTEROLEMIA, PURE  . ANEMIA NOS  . THROMBOCYTOPENIA  . HYPERTENSION  . ATRIAL FIBRILLATION  . CEREBRAL HEMORRHAGE  . 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 OSTEOARTHRITIS  . Right knee pain  . Fungal nail infection  . CAP (community acquired pneumonia)  . Throat pain    Past Medical History:  Past Medical History  Diagnosis Date  . Diabetes mellitus     type II  . Atrial fibrillation     stopped coumadin due to cerebral hem.  . Hypertension     intolerance to ace  . Anemia   . Arthritis     osteoarthritis  . Cataract   . Macular degeneration   . Disc degeneration, lumbar   . Cerebral hemorrhage 2011    amyloid angiopathy with cerebral hemorrhage  . CAP (community acquired pneumonia) 11/2010    RUL   Past Surgical History:  Past Surgical History  Procedure Date  . Left ear surgery   . Hernia repair     as a child  . Colon surgery     bowel obstruction surgery  . Appendectomy   . Tonsillectomy   . Coronary angioplasty     CAD//2D ECHO - mild LVF and dilated EF 35-45%(12/2003)//cardiolite abn (07/2005)  . Carotid screen 10/2002    mild stenosis, neg AAA screen, ABI's normal  . Colonoscopy 12/2005    polyp, tics  . Ct brain 10/2007    small subdural hygromas-- no acute change  . 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 Pacemaker insertion     PT Assessment/Plan/Recommendation PT Assessment Clinical Impression Statement: Pt with h/o CVA 1 yr ago with resultant decr memory, however highly functional PTA. Now s/p ? seizure events with AMS and decr balance. Will benefit from continued PT to incr safety and independence prior to ultimate d/c home with wife's assist. PT Recommendation/Assessment: Patient  will need skilled PT in the acute care venue PT Problem List: Decreased balance;Decreased mobility;Decreased cognition;Decreased knowledge of use of DME;Decreased safety awareness Barriers to Discharge: Decreased caregiver support;Other (comment) (wife is also elderly; can provide limited physical assist) PT Therapy Diagnosis : Difficulty walking;Altered mental status PT Plan PT Frequency: Min 4X/week PT Treatment/Interventions: DME instruction;Gait training;Stair training;Functional mobility training;Therapeutic activities;Balance training;Cognitive remediation;Patient/family education PT Recommendation Recommendations for Other Services: Rehab consult Follow Up Recommendations: Inpatient Rehab Equipment Recommended: Defer to next venue PT Goals  Acute Rehab PT Goals PT Goal Formulation: With patient/family Time For Goal Achievement: 7 days Pt will go Supine/Side to Sit: with modified independence;Other (comment) (demonstrating good safety awareness) PT Goal: Supine/Side to Sit - Progress: Not met Pt will go Sit to Supine/Side: with modified independence PT Goal: Sit to Supine/Side - Progress: Not met Pt will go Sit to Stand: with supervision (no LOB over 10 trials) PT Goal: Sit to Stand - Progress: Not met Pt will go Stand to Sit: with supervision PT Goal: Stand to Sit - Progress: Not met Pt will Stand: with supervision;3 - 5 min;with unilateral upper extremity support (reaching forward >8 inches with return to upright no LOB) PT Goal: Stand - Progress: Not met Pt will Ambulate: >150 feet;with supervision;with least restrictive assistive device PT Goal: Ambulate - Progress: Not met Pt will Go Up / Down Stairs: 1-2 stairs;with  min assist;with rail(s) PT Goal: Up/Down Stairs - Progress: Not met  PT Evaluation Precautions/Restrictions  Precautions Precautions: Fall Required Braces or Orthoses: No Restrictions Weight Bearing Restrictions: No Prior Functioning  Home Living Lives  With: Spouse Type of Home: House Home Layout: One level Home Access: Stairs to enter Entrance Stairs-Rails: Right;Left;Can reach both Entrance Stairs-Number of Steps: 2 Bathroom Shower/Tub: Tub/shower unit;Walk-in shower Bathroom Toilet: Standard Home Adaptive Equipment: Straight cane Prior Function Level of Independence: Independent with basic ADLs;Independent with gait;Other (comment) (Exercises 5 times per week) Driving: No (not since stroke 1 yr ago ) Vocation: Retired Financial risk analyst Arousal/Alertness: Lethargic (sleeping on arrival, minimal difficulty arousing pt) Overall Cognitive Status: History of cognitive impairments History of Cognitive Impairment: Decline in baseline functioning Orientation Level: Oriented to person;Disoriented to place;Disoriented to situation (pt thought he was at home (later knew he was at hospital),) Safety/Judgement: Decreased awareness of safety precautions;Decreased safety judgement for tasks assessed Decreased Safety/Judgement: Decreased awareness of need for assistance Safety/Judgement - Other Comments: after losing balance several times, still thought he was safe to get up alone to go to bathroom Awareness of Deficits: Decreased awareness of deficits Cognition - Other Comments: Wife reports cognition improved today, yet still not at baseline Sensation/Coordination Sensation Light Touch: Appears Intact Coordination Gross Motor Movements are Fluid and Coordinated: Yes Fine Motor Movements are Fluid and Coordinated: No Extremity Assessment RUE Assessment RUE Assessment: Within Functional Limits (?dec in dexterity?) LUE Assessment LUE Assessment: Within Functional Limits (?dec in dexterity?) RLE Assessment RLE Assessment: Within Functional Limits LLE Assessment LLE Assessment: Within Functional Limits Mobility (including Balance) Bed Mobility Rolling Left: 6: Modified independent (Device/Increase time) Left Sidelying to Sit: 5:  Supervision Transfers Sit to Stand: 4: Min assist;From bed;From toilet;From chair/3-in-1 Sit to Stand Details (indicate cue type and reason): Assist needed as patient with multiple LOB's with sit to stand from various surfaces. VC for hand placement. Stand to Sit: 4: Min assist;To chair/3-in-1;To toilet Stand to Sit Details: VC for hand placement and assist needed to control descent  Transfers Sit to Stand: 4: Min assist;From bed;From toilet;From chair/3-in-1 Sit to Stand Details (indicate cue type and reason): Assist needed as patient with multiple LOB's with sit to stand from various surfaces. VC for hand placement. Stand to Sit: 4: Min assist;To chair/3-in-1;To toilet Stand to Sit Details: VC for hand placement and assist needed to control descent Ambulation/Gait Ambulation/Gait: Yes Ambulation/Gait Assistance: 4: Min assist Ambulation/Gait Assistance Details (indicate cue type and reason): with RW no loss of balance, however pt highly distractable and with poor maneuvering of RW and does not maintain safe distance to RW; without RW pt slightly unsteady and required min assist Ambulation Distance (Feet): 150 Feet Assistive device: Rolling walker;None (see gait details) Gait Pattern: Decreased step length - right;Decreased step length - left;Right flexed knee in stance;Left flexed knee in stance Stairs: No  Posture/Postural Control Posture/Postural Control: No significant limitations Static Sitting Balance Static Sitting - Balance Support: No upper extremity supported;Feet supported Static Sitting - Level of Assistance: 5: Stand by assistance Static Sitting - Comment/# of Minutes: Supervision for safety due to decr cognition/safety awareness Static Standing Balance Static Standing - Balance Support: No upper extremity supported Static Standing - Level of Assistance: 4: Min assist Static Standing - Comment/# of Minutes: at sink for ADL's with OT for 4 minutes with increasing lean to Rt  and pt making no attempts to self-correct; when standing in front of chair, pt with posterior lean/loss of balance Rhomberg - Eyes Opened:  30  Rhomberg - Eyes Closed: 10  (loss of balance forward (stepping) after 10 seconds) Dynamic Standing Balance Dynamic Standing - Balance Support: No upper extremity supported Dynamic Standing - Level of Assistance: 4: Min assist Dynamic Standing - Comments: forward reaching with posterior LOB on return to upright standing requiring assist to prevent fall into chair behind him; occurred with two attempts at forward reaching Exercise    End of Session PT - End of Session Equipment Utilized During Treatment: Gait belt Activity Tolerance: Patient tolerated treatment well Patient left: in chair;with call bell in reach;with family/visitor present;Other (comment) (instructed to call for assist prior to getting up) Nurse Communication: Mobility status for transfers;Mobility status for ambulation;Other (comment) (d/c planning) General Behavior During Session: [facility] for tasks performed  Marcele Kosta 01/31/2011, 12:19 PM Pager [phone]

## 2011-01-31 NOTE — Progress Notes (Signed)
Subjective: Patient lying in bed in no acute distress. Following command. He was able to tell me that he has 5 children, recognize wife who was at bed side. He is feeling well. No complaints.  Objective: Filed Vitals:   01/30/11 2210 01/31/11 0200 01/31/11 0554 01/31/11 1000  BP: 165/82 157/81 158/81 138/75  Pulse: 70 76 71 71  Temp: 98.3 F (36.8 C) 98.4 F (36.9 C) 97.7 F (36.5 C) 98.3 F (36.8 C)  TempSrc: Axillary Axillary  Oral  Resp: 20 18 18 18   Height:      Weight:      SpO2: 92% 93% 93% 92%   Weight change:   Intake/Output Summary (Last 24 hours) at 01/31/11 1323 Last data filed at 01/31/11 0700  Gross per 24 hour  Intake    362 ml  Output      0 ml  Net    362 ml    General: Alert, awake, oriented 2, in no acute distress.  HEENT: No bruits, no goiter.  Heart: Regular rate and rhythm, without murmurs, rubs, gallops.  Lungs: Crackles left side, bilateral air movement.  Abdomen: Soft, nontender, nondistended, positive bowel sounds.  Neuro: Grossly intact, nonfocal. Extremities; no edema/   Lab Results:  Basename 01/31/11 0530 01/30/11 0500  NA 139 140  K 3.7 3.5  CL 105 104  CO2 17* 25  GLUCOSE 111* 132*  BUN 19 19  CREATININE 0.60 0.80  CALCIUM 9.2 9.6  MG -- --  PHOS -- --    Basename 01/29/11 0111  AST 18  ALT 14  ALKPHOS 90  BILITOT 0.6  PROT 6.8  ALBUMIN 3.8    Basename 01/31/11 0530 01/30/11 0500 01/29/11 0111  WBC 9.7 8.7 --  NEUTROABS -- -- 5.8  HGB 13.7 13.4 --  HCT 39.2 39.3 --  MCV 91.4 92.7 --  PLT 92* 105* --    Basename 01/29/11 2203 01/29/11 1411 01/29/11 0746  CKTOTAL 1031* 939* 460*  CKMB 15.3* 19.9* 13.3*  CKMBINDEX -- -- --  TROPONINI <0.30 <0.30 <0.30    Basename 01/29/11 0747  HGBA1C 7.6*   Micro Results: Recent Results (from the past 240 hour(s))  URINE CULTURE     Status: Normal   Collection Time   01/29/11  2:00 AM      Component Value Range Status Comment   Specimen Description URINE, RANDOM   Final     Special Requests NONE   Final    Setup Time [id number]   Final    Colony Count NO GROWTH   Final    Culture NO GROWTH   Final    Report Status 01/30/2011 FINAL   Final     Studies/Results: No results found.  Medications: I have reviewed the patient's current medications.  1. Seizure  Likely secondary to prior stroke, encephalomalacia.  EEG pending.  Continue with Dilantin.  CT negative for worsening hemorrhage.  2. CAD  Continue with crestor, atenolol.  3. DM2  Continue with SSI.  4. HTN  Continue with atenolol.  5. Atrial fibrillation  Continue with atenolol.  Continue with baby  6-Confusion:   Wife notice significant change in Mr Britten cognition since seizure episode. She feels he is doing better today than yesterday.  Differential: Delirium, Medications (dilantin), . Dilantin level at 12.  I will ask neuro to see, Should we continue with dilantin? Is this just worsening dementia?   7-Thrombocytopenia: Monitor. Chronic. Platelet range  (87-117) one year ago.  Disposition: PT recommend  inpatient rehab consult.    LOS: 2 days   Keysi Oelkers M.D.  [facility] 01/31/2011, 1:23 PM

## 2011-01-31 NOTE — Consult Note (Signed)
Reason for Consult: "seizure"  HPI: Tony Camacho is an 77 y.o. male. who had a seizure several days ago. Was put on Dilantin with his level being therapeutic. Noted to be acting somewhat off since the seizure by the wife. Patient may be demented, but this has not been officially diagnosed. He is confused in the hospital.  Past Medical History  Diagnosis Date  . Diabetes mellitus     type II  . Atrial fibrillation     stopped coumadin due to cerebral hem.  . Hypertension     intolerance to ace  . Anemia   . Arthritis     osteoarthritis  . Cataract   . Macular degeneration   . Disc degeneration, lumbar   . Cerebral hemorrhage 2011    amyloid angiopathy with cerebral hemorrhage  . CAP (community acquired pneumonia) 11/2010    RUL   Past Surgical History  Procedure Date  . Left ear surgery   . Hernia repair     as a child  . Colon surgery     bowel obstruction surgery  . Appendectomy   . Tonsillectomy   . Coronary angioplasty     CAD//2D ECHO - mild LVF and dilated EF 35-45%(12/2003)//cardiolite abn (07/2005)  . Carotid screen 10/2002    mild stenosis, neg AAA screen, ABI's normal  . Colonoscopy 12/2005    polyp, tics  . Ct brain 10/2007    small subdural hygromas-- no acute change  . 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 Pacemaker insertion    Family History  Problem Relation Age of Onset  . Diabetes Mother   . Cancer Mother     stomach  . Diabetes Father   . Diabetes Sister   . Diabetes Brother    Social History:  reports that he has quit smoking. He does not have any smokeless tobacco history on file. He reports that he drinks alcohol. He reports that he does not use illicit drugs.  Allergies:  Allergies  Allergen Reactions  . Lisinopril     REACTION: hypotension  . Penicillins     REACTION: ?  . Rofecoxib     REACTION: edema  . Rosiglitazone Maleate     REACTION: swelling  . Vancomycin     ?? Question reaction to  this (said vanemycin in file at home)   Medications: I have reviewed the patient's current medications.  ROS: as above  Blood pressure 130/79, pulse 75, temperature 97.3 F (36.3 C), temperature source Oral, resp. rate 18, height 5\' 8"  (1.727 m), weight 74.3 kg (163 lb 12.8 oz), SpO2 94.00%.  Neurological exam: AAO*2. No aphasia. Followed complex commands. Cranial nerves: EOMI, PERRL. Visual fields were full. Sensation to V1 through V3 areas of the face was intact and symmetric throughout. There was no facial asymmetry. Hearing to finger rub was equal and symmetrical bilaterally. Shoulder shrug was 5/5 and symmetric bilaterally. Head rotation was 5/5 bilaterally. There was no dysarthria or palatal deviation. Motor: strength was 5/5 and symmetric throughout. Sensory: was intact throughout to light touch, pinprick. Coordination: finger-to-nose and intact and symmetric bilaterally. Reflexes: were 2+ in upper extremities and 1+ at the knees and 1+ at the ankles. Plantar response was downgoing bilaterally. Gait: deferred  Results for orders placed during the hospital encounter of 01/29/11 (from the past 48 hour(s))  GLUCOSE, CAPILLARY     Status: Abnormal   Collection Time   01/29/11  4:57 PM  Component Value Range Comment   Glucose-Capillary 165 (*) 70 - 99 (mg/dL)    Comment 1 Notify RN     CARDIAC PANEL(CRET KIN+CKTOT+MB+TROPI)     Status: Abnormal   Collection Time   01/29/11 10:03 PM      Component Value Range Comment   Total CK 1031 (*) 7 - 232 (U/L)    CK, MB 15.3 (*) 0.3 - 4.0 (ng/mL) CRITICAL VALUE NOTED.  VALUE IS CONSISTENT WITH PREVIOUSLY REPORTED AND CALLED VALUE.   Troponin I <0.30  <0.30 (ng/mL)    Relative Index 1.5  0.0 - 2.5    GLUCOSE, CAPILLARY     Status: Abnormal   Collection Time   01/29/11 10:26 PM      Component Value Range Comment   Glucose-Capillary 142 (*) 70 - 99 (mg/dL)   BASIC METABOLIC PANEL     Status: Abnormal   Collection Time   01/29/11 11:51 PM       Component Value Range Comment   Sodium 141  135 - 145 (mEq/L)    Potassium 3.4 (*) 3.5 - 5.1 (mEq/L)    Chloride 106  96 - 112 (mEq/L)    CO2 25  19 - 32 (mEq/L)    Glucose, Bld 137 (*) 70 - 99 (mg/dL)    BUN 19  6 - 23 (mg/dL)    Creatinine, Ser 2.13  0.50 - 1.35 (mg/dL)    Calcium 9.3  8.4 - 10.5 (mg/dL)    GFR calc non Af Amer 79 (*) >90 (mL/min)    GFR calc Af Amer >90  >90 (mL/min)   CBC     Status: Abnormal   Collection Time   01/29/11 11:51 PM      Component Value Range Comment   WBC 9.3  4.0 - 10.5 (K/uL)    RBC 4.06 (*) 4.22 - 5.81 (MIL/uL)    Hemoglobin 12.7 (*) 13.0 - 17.0 (g/dL)    HCT 08.6 (*) 57.8 - 52.0 (%)    MCV 91.6  78.0 - 100.0 (fL)    MCH 31.3  26.0 - 34.0 (pg)    MCHC 34.1  30.0 - 36.0 (g/dL)    RDW 46.9  62.9 - 52.8 (%)    Platelets 103 (*) 150 - 400 (K/uL) CONSISTENT WITH PREVIOUS RESULT  CBC     Status: Abnormal   Collection Time   01/30/11  5:00 AM      Component Value Range Comment   WBC 8.7  4.0 - 10.5 (K/uL)    RBC 4.24  4.22 - 5.81 (MIL/uL)    Hemoglobin 13.4  13.0 - 17.0 (g/dL)    HCT 41.3  24.4 - 01.0 (%)    MCV 92.7  78.0 - 100.0 (fL)    MCH 31.6  26.0 - 34.0 (pg)    MCHC 34.1  30.0 - 36.0 (g/dL)    RDW 27.2  53.6 - 64.4 (%)    Platelets 105 (*) 150 - 400 (K/uL) CONSISTENT WITH PREVIOUS RESULT  BASIC METABOLIC PANEL     Status: Abnormal   Collection Time   01/30/11  5:00 AM      Component Value Range Comment   Sodium 140  135 - 145 (mEq/L)    Potassium 3.5  3.5 - 5.1 (mEq/L)    Chloride 104  96 - 112 (mEq/L)    CO2 25  19 - 32 (mEq/L)    Glucose, Bld 132 (*) 70 - 99 (mg/dL)    BUN 19  6 - 23 (mg/dL)    Creatinine, Ser 1.61  0.50 - 1.35 (mg/dL)    Calcium 9.6  8.4 - 10.5 (mg/dL)    GFR calc non Af Amer 80 (*) >90 (mL/min)    GFR calc Af Amer >90  >90 (mL/min)   GLUCOSE, CAPILLARY     Status: Abnormal   Collection Time   01/30/11  7:18 AM      Component Value Range Comment   Glucose-Capillary 119 (*) 70 - 99 (mg/dL)   GLUCOSE,  CAPILLARY     Status: Abnormal   Collection Time   01/30/11 11:51 AM      Component Value Range Comment   Glucose-Capillary 183 (*) 70 - 99 (mg/dL)   PHENYTOIN LEVEL, TOTAL     Status: Normal   Collection Time   01/30/11  1:41 PM      Component Value Range Comment   Phenytoin Lvl 12.1  10.0 - 20.0 (ug/mL)   GLUCOSE, CAPILLARY     Status: Abnormal   Collection Time   01/30/11  4:54 PM      Component Value Range Comment   Glucose-Capillary 224 (*) 70 - 99 (mg/dL)   GLUCOSE, CAPILLARY     Status: Abnormal   Collection Time   01/30/11 10:14 PM      Component Value Range Comment   Glucose-Capillary 113 (*) 70 - 99 (mg/dL)    Comment 1 Documented in Chart      Comment 2 Notify RN     CBC     Status: Abnormal   Collection Time   01/31/11  5:30 AM      Component Value Range Comment   WBC 9.7  4.0 - 10.5 (K/uL)    RBC 4.29  4.22 - 5.81 (MIL/uL)    Hemoglobin 13.7  13.0 - 17.0 (g/dL)    HCT 09.6  04.5 - 40.9 (%)    MCV 91.4  78.0 - 100.0 (fL)    MCH 31.9  26.0 - 34.0 (pg)    MCHC 34.9  30.0 - 36.0 (g/dL)    RDW 81.1  91.4 - 78.2 (%)    Platelets 92 (*) 150 - 400 (K/uL) CONSISTENT WITH PREVIOUS RESULT  BASIC METABOLIC PANEL     Status: Abnormal   Collection Time   01/31/11  5:30 AM      Component Value Range Comment   Sodium 139  135 - 145 (mEq/L)    Potassium 3.7  3.5 - 5.1 (mEq/L)    Chloride 105  96 - 112 (mEq/L)    CO2 17 (*) 19 - 32 (mEq/L)    Glucose, Bld 111 (*) 70 - 99 (mg/dL)    BUN 19  6 - 23 (mg/dL)    Creatinine, Ser 9.56  0.50 - 1.35 (mg/dL)    Calcium 9.2  8.4 - 10.5 (mg/dL)    GFR calc non Af Amer 90 (*) >90 (mL/min)    GFR calc Af Amer >90  >90 (mL/min)   GLUCOSE, CAPILLARY     Status: Abnormal   Collection Time   01/31/11  5:50 AM      Component Value Range Comment   Glucose-Capillary 115 (*) 70 - 99 (mg/dL)   PHENYTOIN LEVEL, TOTAL     Status: Normal   Collection Time   01/31/11  8:27 AM      Component Value Range Comment   Phenytoin Lvl 12.4  10.0 -  20.0 (ug/mL)   GLUCOSE, CAPILLARY     Status:  Abnormal   Collection Time   01/31/11 11:47 AM      Component Value Range Comment   Glucose-Capillary 185 (*) 70 - 99 (mg/dL)   GLUCOSE, CAPILLARY     Status: Abnormal   Collection Time   01/31/11  4:23 PM      Component Value Range Comment   Glucose-Capillary 164 (*) 70 - 99 (mg/dL)    Assessment/Plan: 77 years old man that by history appears to have episodic confusion with significant amount of generalized brain atrophy on CT head including temporal lobes. He also has old left encephalomalacia. I think he was a highly functional individual who can compensate well for his deficits, but is nevertheless demented. His visual problems may it more likely for him to get confused in unfamiliar environments. 1) B12/Folate, TFT, RPR 2) Outpatient neurology follow-up for official dementia evaluation 3) Rule out toxic, metabolic and infectious etiologies of confusion 4) Cont. Dilantin at current dose 5) Call with questions  Sesilia Poucher 01/31/2011, 4:51 PM

## 2011-01-31 NOTE — Progress Notes (Signed)
   CARE MANAGEMENT NOTE 01/31/2011  Patient:  Tony Camacho, Tony Camacho   Account Number:  [id number]  Date Initiated:  01/29/2011  Documentation initiated by:  Donn Pierini  Subjective/Objective Assessment:   Pt admitted with Seizure     Action/Plan:   PTA pt lived at home with spouse, was independent with ADLs   Anticipated DC Date:  02/02/2011   Anticipated DC Plan:  IP REHAB FACILITY      DC Planning Services  CM consult      Choice offered to / List presented to:             Status of service:  In process, will continue to follow Medicare Important Message given?   (If response is "NO", the following Medicare IM given date fields will be blank) Date Medicare IM given:   Date Additional Medicare IM given:    Discharge Disposition:    Per UR Regulation:  Reviewed for med. necessity/level of care/duration of stay  Comments:  PCP- [facility]  01/31/11- 1425- Donn Pierini RN, BSN [phone] Per PT/OT notes recommending CIR- MD order for CIR consult placed today. CM to follow for d/c planning.

## 2011-01-31 NOTE — Progress Notes (Signed)
Occupational Therapy Evaluation Patient Details Name: Tony Camacho MRN: [id number] DOB: [birth year] Today's Date: 01/31/2011  Problem List:  Patient Active Problem List  Diagnoses  . DIABETES MELLITUS, TYPE II  . HYPERCHOLESTEROLEMIA, PURE  . ANEMIA NOS  . THROMBOCYTOPENIA  . HYPERTENSION  . ATRIAL FIBRILLATION  . CEREBRAL HEMORRHAGE  . 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 OSTEOARTHRITIS  . Right knee pain  . Fungal nail infection  . CAP (community acquired pneumonia)  . Throat pain    Past Medical History:  Past Medical History  Diagnosis Date  . Diabetes mellitus     type II  . Atrial fibrillation     stopped coumadin due to cerebral hem.  . Hypertension     intolerance to ace  . Anemia   . Arthritis     osteoarthritis  . Cataract   . Macular degeneration   . Disc degeneration, lumbar   . Cerebral hemorrhage 2011    amyloid angiopathy with cerebral hemorrhage  . CAP (community acquired pneumonia) 11/2010    RUL   Past Surgical History:  Past Surgical History  Procedure Date  . Left ear surgery   . Hernia repair     as a child  . Colon surgery     bowel obstruction surgery  . Appendectomy   . Tonsillectomy   . Coronary angioplasty     CAD//2D ECHO - mild LVF and dilated EF 35-45%(12/2003)//cardiolite abn (07/2005)  . Carotid screen 10/2002    mild stenosis, neg AAA screen, ABI's normal  . Colonoscopy 12/2005    polyp, tics  . Ct brain 10/2007    small subdural hygromas-- no acute change  . 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 Pacemaker insertion     OT Assessment/Plan/Recommendation OT Assessment Clinical Impression Statement: Patient admitted with breakthrough seizures and presents with AMS, weakness and other below listed problems. Patient currently not safe to d/c home with wife and will require additional therapy. Will benefit from skilled OT in the acute setting to maximize independence in order to  facilitate safe d/c to next venue of care or home. OT Recommendation/Assessment: Patient will need skilled OT in the acute care venue OT Problem List: Decreased strength;Decreased activity tolerance;Decreased safety awareness;Decreased knowledge of use of DME or AE;Impaired UE functional use OT Therapy Diagnosis : Generalized weakness OT Plan OT Frequency: Min 2X/week OT Treatment/Interventions: Self-care/ADL training;Therapeutic exercise;DME and/or AE instruction;Therapeutic activities;Patient/family education OT Recommendation Recommendations for Other Services: Rehab consult Follow Up Recommendations: Inpatient Rehab Equipment Recommended:  (TBD) Individuals Consulted Consulted and Agree with Results and Recommendations: Family member/caregiver;Patient Family Member Consulted: wife OT Goals Acute Rehab OT Goals OT Goal Formulation: With patient Time For Goal Achievement: 7 days ADL Goals Pt Will Perform Upper Body Bathing: Independently;Sitting in shower ADL Goal: Upper Body Bathing - Progress: Progressing toward goals Pt Will Perform Lower Body Bathing: with modified independence;Sit to stand in shower ADL Goal: Lower Body Bathing - Progress: Progressing toward goals Pt Will Transfer to Toilet: Independently;Ambulation;Regular height toilet ADL Goal: Toilet Transfer - Progress: Progressing toward goals Pt Will Perform Toileting - Clothing Manipulation: Independently;Standing ADL Goal: Toileting - Clothing Manipulation - Progress: Progressing toward goals Pt Will Perform Tub/Shower Transfer: with supervision;Shower transfer;Ambulation ADL Goal: Web designer - Progress: Progressing toward goals  OT Evaluation Precautions/Restrictions  Precautions Precautions: Fall Required Braces or Orthoses: No Restrictions Weight Bearing Restrictions: No Prior Functioning Home Living Lives With: Spouse Type of Home: House Home  Layout: One level Home Access: Stairs to  enter Entrance Stairs-Rails: Right;Left;Can reach both Entrance Stairs-Number of Steps: 2 Bathroom Shower/Tub: Tub/shower unit;Walk-in shower Bathroom Toilet: Standard Home Adaptive Equipment: Straight cane Prior Function Level of Independence: Independent with basic ADLs;Independent with gait;Other (comment) (Exercises 5 times per week) Driving: No (not since stroke 1 yr ago ) Vocation: Retired ADL ADL Eating/Feeding: Simulated;Minimal assistance Eating/Feeding Details (indicate cue type and reason): Patient with decreased dexterity, dropping utensils frequently. Wife states this is not baseline.  Where Assessed - Eating/Feeding: Chair Grooming: Performed;Teeth care;Wash/dry hands;Supervision/safety Grooming Details (indicate cue type and reason): Decreased dexterity causing for need for increased time and increased difficulty with manipulating grooming items. Where Assessed - Grooming: Standing at sink Upper Body Bathing: Not assessed Lower Body Bathing: Not assessed Upper Body Dressing: Performed;Set up;Supervision/safety Where Assessed - Upper Body Dressing: Sitting, bed Lower Body Dressing: Simulated;Minimal assistance Lower Body Dressing Details (indicate cue type and reason): sequencing and attention to task Toilet Transfer: Performed;Minimal assistance Toilet Transfer Details (indicate cue type and reason): Min assist sit to stand from toilet as patient with posterior LOB Toilet Transfer Method: Ambulating Toilet Transfer Equipment: Regular height toilet;Grab bars Toileting - Clothing Manipulation: Performed (Min guard assist) Where Assessed - Glass blower/designer Manipulation: Standing Toileting - Hygiene: Performed;Supervision/safety Where Assessed - Toileting Hygiene: Sit on 3-in-1 or toilet Tub/Shower Transfer: Not assessed Vision/Perception  Vision - History Patient Visual Report: No change from baseline Cognition Cognition Arousal/Alertness: Lethargic (sleeping on  arrival, minimal difficulty arousing pt) Overall Cognitive Status: History of cognitive impairments History of Cognitive Impairment: Decline in baseline functioning Orientation Level: Oriented to person;Disoriented to place;Disoriented to situation (pt thought he was at home (later knew he was at hospital),) Safety/Judgement: Decreased awareness of safety precautions;Decreased safety judgement for tasks assessed Decreased Safety/Judgement: Decreased awareness of need for assistance Safety/Judgement - Other Comments: after losing balance several times, still thought he was safe to get up alone to go to bathroom Awareness of Deficits: Decreased awareness of deficits Cognition - Other Comments: Wife reports cognition improved today, yet still not at baseline Sensation/Coordination Sensation Light Touch: Appears Intact Coordination Gross Motor Movements are Fluid and Coordinated: Yes Fine Motor Movements are Fluid and Coordinated: No Extremity Assessment RUE Assessment RUE Assessment: Within Functional Limits (?dec in dexterity?) LUE Assessment LUE Assessment: Within Functional Limits (?dec in dexterity?) Mobility  Bed Mobility Rolling Left: 6: Modified independent (Device/Increase time) Left Sidelying to Sit: 5: Supervision Transfers Sit to Stand: 4: Min assist;From bed;From toilet;From chair/3-in-1 Sit to Stand Details (indicate cue type and reason): Assist needed as patient with multiple LOB's with sit to stand from various surfaces. VC for hand placement. Stand to Sit: 4: Min assist;To chair/3-in-1;To toilet Stand to Sit Details: VC for hand placement and assist needed to control descent Exercises   End of Session OT - End of Session Equipment Utilized During Treatment: Gait belt Activity Tolerance: Patient tolerated treatment well Patient left: in chair;with call bell in reach;with family/visitor present Nurse Communication: Mobility status for transfers;Mobility status for  ambulation   Eligah Anello 01/31/2011, 11:34 AM

## 2011-02-01 LAB — RPR: RPR Ser Ql: NONREACTIVE

## 2011-02-01 LAB — GLUCOSE, CAPILLARY: Glucose-Capillary: 122 mg/dL — ABNORMAL HIGH (ref 70–99)

## 2011-02-01 NOTE — Progress Notes (Signed)
Rehab admissions - Evaluated for possible admission.  Please see rehab MD consult done 12/26 by Dr. Riley Kill recommending [facility] follow-up. Progressing with therapies and may be able to discharge directly home with [facility].  Will check back again in am for plans.  Pager [phone]

## 2011-02-01 NOTE — Progress Notes (Signed)
Subjective: Calm cooperative oriented x3 sitting up in the chair and not c/o of any pain  Objective: Filed Vitals:   02/01/11 0255 02/01/11 0451 02/01/11 0950 02/01/11 1400  BP: 137/70 162/80 146/70 131/75  Pulse: 68 69 75 78  Temp: 97.5 F (36.4 C) 97.2 F (36.2 C) 98.1 F (36.7 C) 97.9 F (36.6 C)  TempSrc: Oral Axillary Oral Oral  Resp: 18 18 18 18   Height:      Weight:      SpO2: 94% 94% 94% 96%   Weight change:   Intake/Output Summary (Last 24 hours) at 02/01/11 1638 Last data filed at 02/01/11 0526  Gross per 24 hour  Intake    244 ml  Output      0 ml  Net    244 ml    General: Alert, awake, oriented 3, in no acute distress.  Marland Kitchen Heart: Regular rate and rhythm, without murmurs, rubs, gallops.  Lungs: good bilateral air movement.  Abdomen: Soft, nontender, nondistended, positive bowel sounds.  Neuro: Grossly intact, nonfocal. Extremities; no edema/   Lab Results:  Basename 01/31/11 0530 01/30/11 0500  NA 139 140  K 3.7 3.5  CL 105 104  CO2 17* 25  GLUCOSE 111* 132*  BUN 19 19  CREATININE 0.60 0.80  CALCIUM 9.2 9.6  MG -- --  PHOS -- --    Basename 01/31/11 0530 01/30/11 0500  WBC 9.7 8.7  NEUTROABS -- --  HGB 13.7 13.4  HCT 39.2 39.3  MCV 91.4 92.7  PLT 92* 105*    Basename 01/29/11 2203  CKTOTAL 1031*  CKMB 15.3*  CKMBINDEX --  TROPONINI <0.30    Micro Results: Recent Results (from the past 240 hour(s))  URINE CULTURE     Status: Normal   Collection Time   01/29/11  2:00 AM      Component Value Range Status Comment   Specimen Description URINE, RANDOM   Final    Special Requests NONE   Final    Setup Time [id number]   Final    Colony Count NO GROWTH   Final    Culture NO GROWTH   Final    Report Status 01/30/2011 FINAL   Final      Medications: I have reviewed the patient's current medications.  1. Seizure  Likely secondary to prior stroke, encephalomalacia.  Continue with Dilantin as per neuro recs CT negative for new  hemorrhagic stroke.   2. CAD  Continue with crestor, atenolol.   3. DM2  Continue with SSI.   4. HTN  Continue with atenolol.   5. Atrial fibrillation  Continue with atenolol.  Continue with baby aspirin Not a candidate for coumadin due to prior hemorrhagic stroke   6-Mild dementia - w/u for reversible causes pending    7-Thrombocytopenia: Monitor. Chronic. Platelet range  (87-117) one year ago.   Disposition: SNF  LOS: 3 days   Jabarie Pop M.D.  [facility] 02/01/2011, 4:38 PM

## 2011-02-01 NOTE — Progress Notes (Signed)
Physical Therapy Treatment Patient Details Name: Tony Camacho MRN: [id number] DOB: [birth year] Today's Date: 02/01/2011  PT Assessment/Plan  PT - Assessment/Plan Comments on Treatment Session: Pt progressing with PT goals.  Improved steadiness/balance noted with ambulation as evident in requiring decreased (A) today.   PT Frequency: Min 4X/week Follow Up Recommendations: Inpatient Rehab Equipment Recommended: Defer to next venue PT Goals  Acute Rehab PT Goals PT Goal: Supine/Side to Sit - Progress: Progressing toward goal PT Goal: Sit to Stand - Progress: Progressing toward goal PT Goal: Stand to Sit - Progress: Progressing toward goal PT Goal: Ambulate - Progress: Progressing toward goal  PT Treatment Precautions/Restrictions  Precautions Precautions: Fall Required Braces or Orthoses: No Restrictions Weight Bearing Restrictions: No Mobility (including Balance) Bed Mobility Rolling Left: 6: Modified independent (Device/Increase time) Left Sidelying to Sit: 6: Modified independent (Device/Increase time) Sitting - Scoot to Edge of Bed: 6: Modified independent (Device/Increase time) Transfers Sit to Stand: Other (comment);From bed;With upper extremity assist;Without upper extremity assist (Min Guard (A)) Sit to Stand Details (indicate cue type and reason): No LOB noted today.  Performed 5 trials of sit<>stand without use of UE's for strengthening & activity tolerance but reiterated the use of UE's for safety.   Stand to Sit: Other (comment) (Min Guard (A)) Stand to Sit Details: Cues for hand placement for controlled descent & safety, however pt also able to perform without UE use with min Guard while performing sit<>stand trials for strengthening/activity tolerance activity.   Ambulation/Gait Ambulation/Gait Assistance: Other (comment) (Min Guard (A)) Ambulation/Gait Assistance Details (indicate cue type and reason): Guarding for safety due to mild unsteadiness.   Ambulation  Distance (Feet): 300 Feet Assistive device: None Gait Pattern: Step-through pattern;Decreased stride length Stairs: No Wheelchair Mobility Wheelchair Mobility: No    Exercise    End of Session PT - End of Session Equipment Utilized During Treatment: Gait belt Activity Tolerance: Patient tolerated treatment well Patient left: in chair;with call bell in reach General Behavior During Session: [facility] for tasks performed Cognition: [facility] for tasks performed  Lara Mulch 02/01/2011, 12:57 PM [phone]

## 2011-02-02 LAB — BASIC METABOLIC PANEL
BUN: 13 mg/dL (ref 6–23)
CO2: 25 mEq/L (ref 19–32)
Chloride: 103 mEq/L (ref 96–112)
GFR calc non Af Amer: 81 mL/min — ABNORMAL LOW (ref >90)
Glucose, Bld: 111 mg/dL — ABNORMAL HIGH (ref 70–99)
Potassium: 4 mEq/L (ref 3.5–5.1)

## 2011-02-02 LAB — GLUCOSE, CAPILLARY

## 2011-02-02 MED ORDER — PHENYTOIN SODIUM EXTENDED 100 MG PO CAPS: 100.0000 mg | ORAL_CAPSULE | Freq: Two times a day (BID) | ORAL | Status: DC

## 2011-02-02 NOTE — Progress Notes (Signed)
Physical Therapy Treatment Patient Details Name: Tony Camacho MRN: [id number] DOB: 01/10/1925 Today's Date: 02/02/2011  PT Assessment/Plan  PT - Assessment/Plan Comments on Treatment Session: Pt moving well.  Wife & daughter present for PT session.  Wife is very happy/pleased with pt's progress & states she now feels like she can care for husband at home.  Pt would benefit from f/u with HHPT.   PT Plan: Discharge plan needs to be updated PT Frequency: Min 4X/week Follow Up Recommendations: Home health PT Equipment Recommended: None recommended by PT PT Goals  Acute Rehab PT Goals PT Goal: Supine/Side to Sit - Progress: Met PT Goal: Sit to Stand - Progress: Progressing toward goal PT Goal: Stand to Sit - Progress: Met PT Goal: Ambulate - Progress: Met PT Goal: Up/Down Stairs - Progress: Met  PT Treatment Precautions/Restrictions  Precautions Precautions: Fall Required Braces or Orthoses: No Restrictions Weight Bearing Restrictions: No Mobility (including Balance) Bed Mobility Rolling Left: 6: Modified independent (Device/Increase time) Transfers Sit to Stand: 5: Supervision;From bed;With upper extremity assist Sit to Stand Details (indicate cue type and reason): (S) for safety only.   Stand to Sit: 6: Modified independent (Device/Increase time);To chair/3-in-1;With upper extremity assist;With armrests Ambulation/Gait Ambulation/Gait Assistance: 5: Supervision Ambulation/Gait Assistance Details (indicate cue type and reason): (S) for safety due to mild unsteadiness/ocassional deviation off straight path but no LOB noted.  Antalgic-like gait secondary to "bum" right knee per pt.   Ambulation Distance (Feet): 350 Feet Assistive device: None Gait Pattern: Step-through pattern;Antalgic Stairs: Yes Stairs Assistance: 5: Supervision Stairs Assistance Details (indicate cue type and reason): (S) for safety & cues for technique/sequencing Stair Management Technique: One rail  Left;Step to pattern;Forwards Number of Stairs: 5  Wheelchair Mobility Wheelchair Mobility: No    Exercise    End of Session PT - End of Session Equipment Utilized During Treatment: Gait belt Activity Tolerance: Patient tolerated treatment well Patient left: in chair;with call bell in reach;with family/visitor present General Behavior During Session: [facility] for tasks performed Cognition: [facility] for tasks performed  Lara Mulch 02/02/2011, 12:55 PM [phone]

## 2011-02-02 NOTE — Progress Notes (Signed)
   CARE MANAGEMENT NOTE 02/02/2011  Patient:  Tony Camacho, Tony Camacho   Account Number:  [id number]  Date Initiated:  01/29/2011  Documentation initiated by:  Donn Pierini  Subjective/Objective Assessment:   Pt admitted with Seizure     Action/Plan:   PTA pt lived at home with spouse, was independent with ADLs   Anticipated DC Date:  02/02/2011   Anticipated DC Plan:  HOME W HOME HEALTH SERVICES  In-house referral  Clinical Social Worker      DC Associate Professor  CM consult      [facility] Choice  HOME HEALTH   Choice offered to / List presented to:  C-1 Patient        HH arranged  HH-2 PT  HH-1 RN      [facility] agency  Care [facility]   Status of service:  Completed, signed off Medicare Important Message given?   (If response is "NO", the following Medicare IM given date fields will be blank) Date Medicare IM given:   Date Additional Medicare IM given:    Discharge Disposition:  HOME W HOME HEALTH SERVICES  Per UR Regulation:  Reviewed for med. necessity/level of care/duration of stay  Comments:  PCP- Tower  02/02/11- 1345- Donn Pierini RN, BSN [phone] Pt for discharge today, pt now wants to go home with [facility], does not want [facility] for rehab. Orders for HH-RN/PT, spoke with pt and wife at bedside, list of agencies for [facility] given to them, agency of choice was [employer]. Referral sent to [facility] via [facility] and call made to [facility]-hospital rep. Services to begin 24-48 hr post discharge.  01/31/11- 1425- Donn Pierini RN, BSN [phone] Per PT/OT notes recommending CIR- MD order for CIR consult placed today. CM to follow for d/c planning.

## 2011-02-02 NOTE — Progress Notes (Signed)
Rehab admissions - I spoke with patient and wife.  Wife is very pleased with progress.  Patient worked crossword puzzle today.  Wife feels she can now take patient home rather than consider rehab.  Recommend home with [facility] follow-up.  Pager [phone]

## 2011-02-02 NOTE — Progress Notes (Signed)
Tony Camacho, PT [phone]  

## 2011-02-12 ENCOUNTER — Ambulatory Visit (INDEPENDENT_AMBULATORY_CARE_PROVIDER_SITE_OTHER): Payer: Medicare Other | Admitting: Family Medicine

## 2011-02-12 ENCOUNTER — Encounter: Payer: Self-pay | Admitting: Family Medicine

## 2011-02-12 VITALS — BP 110/68 | HR 80 | Temp 97.4°F | Ht 68.0 in | Wt 162.8 lb

## 2011-02-12 DIAGNOSIS — R569 Unspecified convulsions: Secondary | ICD-10-CM

## 2011-02-12 DIAGNOSIS — I10 Essential (primary) hypertension: Secondary | ICD-10-CM

## 2011-02-12 NOTE — Assessment & Plan Note (Addendum)
Continues to be well controlled - after hosp for seizure bp in fair control at this time  No changes needed  Disc lifstyle change with low sodium diet and exercise   Will continue to monitor

## 2011-02-12 NOTE — Patient Instructions (Signed)
Follow up with Dr Pearlean Brownie as planned Continue the dilantin Let me know if you need a sleep study set up  Vitals - bp are fine today

## 2011-02-12 NOTE — Assessment & Plan Note (Addendum)
S/p hosp for grand mal seizure times 2 Doing very well on dilantin -tol well  Rev hosp records  Rev CT scan No MRI due to pacer  EEG pos per pt  Will f/u neuro tomorrow  Is not driving or swimming  Suspect that old hem cva plays role - or poss sleep apnea -they will disc with Dr Pearlean Brownie and set up sleep study if advised

## 2011-02-12 NOTE — Progress Notes (Signed)
Subjective:    Patient ID: Tony Camacho, male    DOB: 1924-03-24, 78 y.o.   MRN: [id number]  HPI Here for f/u of seizure  Pt has hosp on 12/24 after seizure activity in bed - grand mal -- shaking/ unconscious, then again in the hospital  Went to ER -labs and bp fine CT head was stable with old hem stroke seen  No MRI due to pace maker  Had EEG   He did not remember anything until he was in hospital for several days  Saw Dr Pearlean Brownie- will see tomorrow  Has enough dilantin to get by    Is on phenytoin -- is doing extremely well on it  Thinks this has made him feel better in general  Wife thinks it has actually improved his mood and memory somewhat  Perhaps it relaxes him   Pt does snore - did disc poss need for sleep study Disc poss that sleep apnea could play role in seizure also   Patient Active Problem List  Diagnoses  . DIABETES MELLITUS, TYPE II  . HYPERCHOLESTEROLEMIA, PURE  . ANEMIA NOS  . THROMBOCYTOPENIA  . HYPERTENSION  . ATRIAL FIBRILLATION  . CEREBRAL HEMORRHAGE  . 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 OSTEOARTHRITIS  . Right knee pain  . Fungal nail infection  . CAP (community acquired pneumonia)  . Throat pain  . Seizure   Past Medical History  Diagnosis Date  . Diabetes mellitus     type II  . Atrial fibrillation     stopped coumadin due to cerebral hem.  . Hypertension     intolerance to ace  . Anemia   . Arthritis     osteoarthritis  . Cataract   . Macular degeneration   . Disc degeneration, lumbar   . Cerebral hemorrhage 2011    amyloid angiopathy with cerebral hemorrhage  . CAP (community acquired pneumonia) 11/2010    RUL   Past Surgical History  Procedure Date  . Left ear surgery   . Hernia repair     as a child  . Colon surgery     bowel obstruction surgery  . Appendectomy   . Tonsillectomy   . Coronary angioplasty     CAD//2D ECHO - mild LVF and dilated EF 35-45%(12/2003)//cardiolite abn (07/2005)  . Carotid screen  10/2002    mild stenosis, neg AAA screen, ABI's normal  . Colonoscopy 12/2005    polyp, tics  . Ct brain 10/2007    small subdural hygromas-- no acute change  . 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 Pacemaker insertion    History  Substance Use Topics  . Smoking status: Former Games developer  . Smokeless tobacco: Not on file  . Alcohol Use: Yes     Rarely   Family History  Problem Relation Age of Onset  . Diabetes Mother   . Cancer Mother     stomach  . Diabetes Father   . Diabetes Sister   . Diabetes Brother    Allergies  Allergen Reactions  . Lisinopril     REACTION: hypotension  . Penicillins     REACTION: ?  . Rofecoxib     REACTION: edema  . Rosiglitazone Maleate     REACTION: swelling  . Vancomycin     ?? Question reaction to this (said vanemycin in file at home)   Current Outpatient Prescriptions on File Prior to Visit  Medication Sig Dispense Refill  .  aspirin 81 MG tablet Take 81 mg by mouth daily.        Marland Kitchen atenolol (TENORMIN) 50 MG tablet TAKE 1/2 TABLET BY MOUTH ONCE DAILY  45 tablet  2  . beta carotene w/minerals (OCUVITE) tablet Take 1 tablet by mouth daily.        . carbonyl iron ([facility] IRON) 45 MG TABS Take 45 mg by mouth daily.        . cholecalciferol (VITAMIN D) 1000 UNITS tablet Take two tablets by mouth daily       . Glucosamine-Chondroit-Vit C-Mn (GLUCOSAMINE 1500 COMPLEX PO) 2 every day by mouth       . metFORMIN (GLUCOPHAGE) 500 MG tablet Take 500 mg by mouth 2 (two) times daily with a meal.        . Methylfol-Methylcob-Acetylcyst (CEREFOLIN NAC) 6-2-600 MG TABS Take 1 tablet by mouth once a day       . Multiple Vitamin (MULTIVITAMIN PO) Take 1 daily       . Omega-3 Fatty Acids (FISH OIL) 1000 MG CAPS Take 1 capsule by mouth 2 (two) times daily.       . phenytoin (DILANTIN) 100 MG ER capsule Take 1 capsule (100 mg total) by mouth 2 (two) times daily.  60 capsule  1  . rosuvastatin (CRESTOR) 10 MG tablet Take 10 mg by  mouth daily.        . Tamsulosin HCl (FLOMAX) 0.4 MG CAPS Take 0.4 mg by mouth daily.        . vitamin B-12 (CYANOCOBALAMIN) 1000 MCG tablet Take 1,000 mcg by mouth daily.        . meloxicam (MOBIC) 7.5 MG tablet Take 1 tablet (7.5 mg total) by mouth daily.  30 tablet  3       Review of Systems Review of Systems  Constitutional: Negative for fever, appetite change, fatigue and unexpected weight change.  Eyes: Negative for pain and visual disturbance.  Respiratory: Negative for cough and shortness of breath.   Cardiovascular: Negative for cp or palpitations    Gastrointestinal: Negative for nausea, diarrhea and constipation.  Genitourinary: Negative for urgency and frequency.  Skin: Negative for pallor or rash   Neurological: Negative for weakness, light-headedness, numbness and headaches.  Hematological: Negative for adenopathy. Does not bruise/bleed easily.  Psychiatric/Behavioral: Negative for dysphoric mood. The patient is not nervous/anxious.           Objective:   Physical Exam  Constitutional: He is oriented to person, place, and time. He appears well-developed and well-nourished. No distress.  HENT:  Head: Normocephalic and atraumatic.  Mouth/Throat: Oropharynx is clear and moist.       Hearing aides noted   Eyes: Conjunctivae and EOM are normal. Pupils are equal, round, and reactive to light. No scleral icterus.  Neck: Normal range of motion. Neck supple. No JVD present. Carotid bruit is not present. No thyromegaly present.  Cardiovascular: Normal rate, regular rhythm, normal heart sounds and intact distal pulses.  Exam reveals no gallop.   Pulmonary/Chest: Effort normal and breath sounds normal. No respiratory distress. He has no wheezes.  Abdominal: Soft. Bowel sounds are normal. He exhibits no distension and no abdominal bruit. There is no tenderness.  Musculoskeletal: He exhibits no edema and no tenderness.  Lymphadenopathy:    He has no cervical adenopathy.    Neurological: He is alert and oriented to person, place, and time. He has normal strength and normal reflexes. He displays no atrophy and no tremor. No cranial  nerve deficit or sensory deficit. He exhibits normal muscle tone. He displays a negative Romberg sign. He displays no seizure activity. Coordination and gait normal.  Skin: Skin is warm and dry. No rash noted. No erythema. No pallor.  Psychiatric: He has a normal mood and affect.       Mentally sharp and conversative Good eye contact and comm skills           Assessment & Plan:

## 2011-02-13 ENCOUNTER — Other Ambulatory Visit: Payer: Self-pay | Admitting: Family Medicine

## 2011-02-13 NOTE — Telephone Encounter (Signed)
[facility] request refill  Flomax 0.4 mg # 90 x 1.

## 2011-02-16 ENCOUNTER — Other Ambulatory Visit: Payer: Self-pay | Admitting: *Deleted

## 2011-02-16 MED ORDER — METFORMIN HCL 500 MG PO TABS: 500.0000 mg | ORAL_TABLET | Freq: Two times a day (BID) | ORAL | Status: DC

## 2011-02-17 NOTE — Discharge Summary (Signed)
Patient ID: Tony Camacho MRN: [id number] DOB/AGE: 1924-05-07 78 y.o. 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         CONTINUE taking these medications         aspirin 81 MG tablet      atenolol 50 MG tablet   Commonly known as: TENORMIN   TAKE 1/2 TABLET BY MOUTH ONCE DAILY      CEREFOLIN NAC 6-2-600 MG Tabs      cholecalciferol 1000 UNITS tablet   Commonly known as: VITAMIN D      [facility] IRON 45 MG Tabs   Generic drug: carbonyl iron      Fish Oil 1000 MG Caps      GLUCOSAMINE 1500 COMPLEX PO      meloxicam 7.5 MG tablet   Commonly known as: MOBIC   Take 1 tablet (7.5 mg total) by mouth daily.      MULTIVITAMIN PO      beta carotene w/minerals tablet      rosuvastatin 10 MG tablet   Commonly known as: CRESTOR      vitamin B-12 1000 MCG tablet   Commonly known as: CYANOCOBALAMIN          Where to get your medications    These are the prescriptions that you need to pick up.   You may get these medications from any pharmacy.         phenytoin 100 MG ER capsule            Discharged Condition: Good   Consults: Neurology  Significant Diagnostic Studies: Dg Chest 2 View  01/29/2011  *RADIOLOGY REPORT*  Clinical Data: Possible seizure.  CHEST - 2 VIEW  Comparison: Chest radiograph performed 12/04/2010  Findings: The lungs are well-aerated.  Vascular congestion is noted.  Mild bibasilar opacities may reflect atelectasis or possibly pneumonia.  Alternatively, mild interstitial edema could have a similar appearance.  There is no evidence of pleural effusion  or pneumothorax.  The heart is enlarged.  A pacemaker is noted at the right chest wall, with leads ending at the right atrium and right ventricle. No acute osseous abnormalities are seen.  IMPRESSION: Mild bibasilar airspace opacities may reflect atelectasis or possibly mild pneumonia.  Alternatively, given vascular congestion and cardiomegaly, mild interstitial edema could have a similar appearance.  Original Report Authenticated By: Tonia Ghent, M.D.   Ct Head Wo Contrast  01/29/2011  *RADIOLOGY REPORT*  Clinical Data: Seizure; abrasion to the forehead.  CT HEAD WITHOUT CONTRAST  Technique:  Contiguous axial images were obtained from the base of the skull through the vertex without contrast.  Comparison: Multiple prior CTs of the head, the most recent of which was performed 11/11/2009  Findings: There is no evidence of acute infarction, mass lesion, or intra- or extra-axial hemorrhage on CT.  Chronic encephalomalacia is noted at the left frontal lobe, with a pattern of increased attenuation peripherally along the gyral folds.  This is more apparent than on the prior CT, and likely reflects interval evolution of previously noted  encephalomalacia due to a hemorrhagic stroke.  There is no evidence of recurrent bleed.  No significant mass effect or midline shift is seen.  There is minimal ex vacuo dilatation of the frontal horn of the left lateral ventricle.  The posterior fossa, including the cerebellum, brainstem and fourth ventricle, is within normal limits.  The basal ganglia are unremarkable in appearance.  There is no evidence of fracture; visualized osseous structures are unremarkable in appearance.  The orbits are within normal limits. The paranasal sinuses and mastoid air cells are well-aerated.  No significant soft tissue abnormalities are seen.  IMPRESSION:  1.  No definite acute intracranial pathology seen on CT. 2.  Interval evolution of chronic encephalomalacia at the left frontal lobe; increased  attenuation along the gyri is more apparent, without evidence of recurrent bleed.  No definite underlying mass seen.  This reflects the prior hemorrhagic stroke in this region.  Original Report Authenticated By: Tonia Ghent, M.D.    Hospital Course:  78 year old man with history of hemorrhagic stroke presented to the emergency room with acute confusion. Assessment in the emergency room indicated that probably the patient has suffered a seizure related to his old hemorrhagic stroke. He remained postictal in the emergency room so he was admitted for further workup and observation. He was loaded with Dilantin intravenously in the emergency room. During the hospitalization the patient was seen by Dr. Lyman Speller from neurology and he recommended that the patient be continued on phenytoin 100 mg po bid. patient's mental status improved during the admission and by the time of discharge he was alert and oriented x3. Patient was evaluated by physical therapy and occupation therapy and he was felt to be safe for him to return home in the care of his wife.  Discharge Exam: Blood pressure 154/85, pulse 69, temperature 98.1 F (36.7 C), temperature source Oral, resp. rate 18, height 5\' 8"  (1.727 m), weight 74.3 kg (163 lb 12.8 oz), SpO2 95.00%. Alert oriented x3 Chest clear to auscultate   Disposition: home  Discharge Orders    Future Appointments: Provider: Department: Dept Phone: Center:   07/24/2011 10:30 AM Roxy Manns, MD [place] [phone] [id number]      Follow-up Information    Follow up with Roxy Manns, MD. Make an appointment in 1 week. (As needed)       Follow up with Gates Rigg, MD. Make an appointment in 1 month.   Contact information:   [address], [address] [facility] [place] [address] [phone]          Signed: Lonia Blood 02/17/2011, 8:04 AM

## 2011-03-22 ENCOUNTER — Encounter: Payer: Self-pay | Admitting: Family Medicine

## 2011-04-19 ENCOUNTER — Other Ambulatory Visit: Payer: Self-pay

## 2011-04-19 MED ORDER — ROSUVASTATIN CALCIUM 10 MG PO TABS: 10.0000 mg | ORAL_TABLET | Freq: Every day | ORAL | Status: DC

## 2011-04-19 NOTE — Telephone Encounter (Signed)
[facility] request refill Crestor 10 mg #30 x 11.

## 2011-07-15 ENCOUNTER — Other Ambulatory Visit: Payer: Self-pay | Admitting: Family Medicine

## 2011-07-17 NOTE — Telephone Encounter (Signed)
Done

## 2011-07-24 ENCOUNTER — Ambulatory Visit (INDEPENDENT_AMBULATORY_CARE_PROVIDER_SITE_OTHER): Payer: Medicare Other | Admitting: Family Medicine

## 2011-07-24 ENCOUNTER — Encounter: Payer: Self-pay | Admitting: Family Medicine

## 2011-07-24 VITALS — BP 118/66 | HR 75 | Temp 97.5°F | Ht 67.0 in | Wt 168.8 lb

## 2011-07-24 DIAGNOSIS — E7800 Pure hypercholesterolemia, unspecified: Secondary | ICD-10-CM

## 2011-07-24 DIAGNOSIS — L708 Other acne: Secondary | ICD-10-CM

## 2011-07-24 DIAGNOSIS — E119 Type 2 diabetes mellitus without complications: Secondary | ICD-10-CM

## 2011-07-24 DIAGNOSIS — L570 Actinic keratosis: Secondary | ICD-10-CM

## 2011-07-24 DIAGNOSIS — D696 Thrombocytopenia, unspecified: Secondary | ICD-10-CM

## 2011-07-24 DIAGNOSIS — L700 Acne vulgaris: Secondary | ICD-10-CM

## 2011-07-24 DIAGNOSIS — I10 Essential (primary) hypertension: Secondary | ICD-10-CM

## 2011-07-24 LAB — CBC WITH DIFFERENTIAL/PLATELET
Basophils Relative: 0.6 % (ref 0.0–3.0)
Eosinophils Absolute: 0.3 10*3/uL (ref 0.0–0.7)
Eosinophils Relative: 4 % (ref 0.0–5.0)
Hemoglobin: 14.6 g/dL (ref 13.0–17.0)
Lymphocytes Relative: 33.6 % (ref 12.0–46.0)
MCHC: 33.8 g/dL (ref 30.0–36.0)
MCV: 98.6 fl (ref 78.0–100.0)
Monocytes Absolute: 0.7 10*3/uL (ref 0.1–1.0)
Neutro Abs: 3.2 10*3/uL (ref 1.4–7.7)
Neutrophils Relative %: 50.3 % (ref 43.0–77.0)
RBC: 4.38 Mil/uL (ref 4.22–5.81)
WBC: 6.5 10*3/uL (ref 4.5–10.5)

## 2011-07-24 LAB — COMPREHENSIVE METABOLIC PANEL
AST: 22 U/L (ref 0–37)
Albumin: 4.3 g/dL (ref 3.5–5.2)
BUN: 13 mg/dL (ref 6–23)
Calcium: 9.4 mg/dL (ref 8.4–10.5)
Chloride: 105 mEq/L (ref 96–112)
Creatinine, Ser: 0.8 mg/dL (ref 0.4–1.5)
Glucose, Bld: 202 mg/dL — ABNORMAL HIGH (ref 70–99)
Potassium: 4.9 mEq/L (ref 3.5–5.1)

## 2011-07-24 LAB — HEMOGLOBIN A1C: Hgb A1c MFr Bld: 7.7 % — ABNORMAL HIGH (ref 4.6–6.5)

## 2011-07-24 LAB — LIPID PANEL
Cholesterol: 130 mg/dL (ref 0–200)
VLDL: 19.4 mg/dL (ref 0.0–40.0)

## 2011-07-24 LAB — TSH: TSH: 5.08 u[IU]/mL (ref 0.35–5.50)

## 2011-07-24 NOTE — Assessment & Plan Note (Signed)
bp in fair control at this time  No changes needed  Disc lifstyle change with low sodium diet and exercise   

## 2011-07-24 NOTE — Progress Notes (Signed)
Subjective:    Patient ID: Tony Camacho, male    DOB: 1924/09/22, 78 y.o.   MRN: [id number]  HPI Here for check up of chronic medical conditions and to review health mt list   Wt is up 6 lb  With bmi of 26  bp is  118/66   Today No cp or palpitations or headaches or edema  No side effects to medicines    colonosc 11/07 with adenomatous polyps and tics Not wanting to do another colonosc at his age   No more seizures   utd on imms   Diabetes Home sugar results -- checks occ am or pm -- about 140 DM diet - is eating well - sticks to DM diet  Exercise - still very good  Symptoms A1C last  Lab Results  Component Value Date   HGBA1C 7.6* 01/29/2011    No problems with medications  Renal protection Last eye exam 10/12 no problems   Prostate - has BPH flomax does a great job -- 2 times nocturia stable - but he also drinks a lot of water   For lipids on crestor Lab Results  Component Value Date   CHOL 95 07/11/2010   HDL 44.20 07/11/2010   LDLCALC 39 07/11/2010   TRIG 57.0 07/11/2010   CHOLHDL 2 07/11/2010     new problem- itching on arm - just itching - no rash at all  For around a month  Is better today  Possibly from gardening   Mole or black head on cheek- there for a long time   Patient Active Problem List  Diagnosis  . DIABETES MELLITUS, TYPE II  . HYPERCHOLESTEROLEMIA, PURE  . ANEMIA NOS  . THROMBOCYTOPENIA  . HYPERTENSION  . ATRIAL FIBRILLATION  . CEREBRAL HEMORRHAGE  . 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 OSTEOARTHRITIS  . Right knee pain  . Fungal nail infection  . Throat pain  . Seizure  . Comedone  . Actinic keratosis   Past Medical History  Diagnosis Date  . Diabetes mellitus     type II  . Atrial fibrillation     stopped coumadin due to cerebral hem.  . Hypertension     intolerance to ace  . Anemia   . Arthritis     osteoarthritis  . Cataract   . Macular degeneration   . Disc degeneration, lumbar   . Cerebral hemorrhage 2011      amyloid angiopathy with cerebral hemorrhage  . CAP (community acquired pneumonia) 11/2010    RUL  . Seizure disorder 12/12    with hx of hem cva in past    Past Surgical History  Procedure Date  . Left ear surgery   . Hernia repair     as a child  . Colon surgery     bowel obstruction surgery  . Appendectomy   . Tonsillectomy   . Coronary angioplasty     CAD//2D ECHO - mild LVF and dilated EF 35-45%(12/2003)//cardiolite abn (07/2005)  . Carotid screen 10/2002    mild stenosis, neg AAA screen, ABI's normal  . Colonoscopy 12/2005    polyp, tics  . Ct brain 10/2007    small subdural hygromas-- no acute change  . 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 Pacemaker insertion    History  Substance Use Topics  . Smoking status: Former Games developer  . Smokeless tobacco: Not on file  . Alcohol Use: Yes     Rarely  Family History  Problem Relation Age of Onset  . Diabetes Mother   . Cancer Mother     stomach  . Diabetes Father   . Diabetes Sister   . Diabetes Brother    Allergies  Allergen Reactions  . Lisinopril     REACTION: hypotension  . Penicillins     REACTION: ?  . Rofecoxib     REACTION: edema  . Rosiglitazone Maleate     REACTION: swelling  . Vancomycin     ?? Question reaction to this (said vanemycin in file at home)   Current Outpatient Prescriptions on File Prior to Visit  Medication Sig Dispense Refill  . aspirin 81 MG tablet Take 81 mg by mouth daily.        Marland Kitchen atenolol (TENORMIN) 50 MG tablet TAKE 1/2 TABLET BY MOUTH ONCE DAILY  45 tablet  2  . beta carotene w/minerals (OCUVITE) tablet Take 1 tablet by mouth daily.        . Calcium 500 MG tablet Take 600 mg by mouth daily.        . carbonyl iron ([facility] IRON) 45 MG TABS Take 45 mg by mouth daily.        . cholecalciferol (VITAMIN D) 1000 UNITS tablet Take two tablets by mouth daily       . Glucosamine-Chondroit-Vit C-Mn (GLUCOSAMINE 1500 COMPLEX PO) 2 every day by mouth        . meloxicam (MOBIC) 7.5 MG tablet Take 1 tablet (7.5 mg total) by mouth daily.  30 tablet  3  . metFORMIN (GLUCOPHAGE) 500 MG tablet Take 1 tablet (500 mg total) by mouth 2 (two) times daily with a meal.  180 tablet  2  . Methylfol-Methylcob-Acetylcyst (CEREFOLIN NAC) 6-2-600 MG TABS Take 1 tablet by mouth once a day       . Multiple Vitamin (MULTIVITAMIN PO) Take 1 daily       . Omega-3 Fatty Acids (FISH OIL) 1000 MG CAPS Take 1 capsule by mouth 2 (two) times daily.       . phenytoin (DILANTIN) 100 MG ER capsule Take 1 capsule (100 mg total) by mouth 2 (two) times daily.  60 capsule  1  . rosuvastatin (CRESTOR) 10 MG tablet Take 1 tablet (10 mg total) by mouth daily.  30 tablet  11  . Tamsulosin HCl (FLOMAX) 0.4 MG CAPS TAKE 1 CAPSULE BY MOUTH EVERY DAY  90 capsule  0  . traMADol (ULTRAM) 50 MG tablet 50 mg every 6 (six) hours as needed.       . vitamin B-12 (CYANOCOBALAMIN) 1000 MCG tablet Take 1,000 mcg by mouth daily.             Review of Systems Review of Systems  Constitutional: Negative for fever, appetite change, fatigue and unexpected weight change.  Eyes: Negative for pain and visual disturbance.  Respiratory: Negative for cough and shortness of breath.   Cardiovascular: Negative for cp or palpitations    Gastrointestinal: Negative for nausea, diarrhea and constipation.  Genitourinary: Negative for urgency and frequency.  Skin: Negative for pallor or rash  pos for itching on R arm without rash / also a blackhead on R cheek Neurological: Negative for weakness, light-headedness, numbness and headaches.  Hematological: Negative for adenopathy. Does not bruise/bleed easily.  Psychiatric/Behavioral: Negative for dysphoric mood. The patient is not nervous/anxious.         Objective:   Physical Exam  Constitutional: He appears well-developed and well-nourished. No distress.  HENT:  Head: Normocephalic and atraumatic.  Right Ear: External ear normal.  Left Ear: External ear  normal.  Nose: Nose normal.  Mouth/Throat: Oropharynx is clear and moist.  Eyes: Conjunctivae and EOM are normal. Pupils are equal, round, and reactive to light. No scleral icterus.  Neck: Normal range of motion. Neck supple. No JVD present. Carotid bruit is not present. No thyromegaly present.  Cardiovascular: Normal rate, regular rhythm, normal heart sounds and intact distal pulses.  Exam reveals no gallop.   Pulmonary/Chest: Effort normal and breath sounds normal. No respiratory distress. He has no wheezes.  Abdominal: Soft. Bowel sounds are normal. He exhibits no distension, no abdominal bruit and no mass. There is no tenderness.  Musculoskeletal: Normal range of motion. He exhibits no edema and no tenderness.  Lymphadenopathy:    He has no cervical adenopathy.  Neurological: He is alert. He has normal reflexes. He displays no tremor. No cranial nerve deficit. He exhibits normal muscle tone. Coordination normal.  Skin: Skin is warm and dry. No rash noted. There is erythema. No pallor.       Diffuse AKs on forearms- worse on R , no rash  I suspect itch is from these   Small comedone on L cheek- was expressed and easily removed   Psychiatric: He has a normal mood and affect.          Assessment & Plan:

## 2011-07-24 NOTE — Patient Instructions (Addendum)
Labs today I'm glad you are doing well  Follow up in 4-6 weeks to treat the spots on your arms Wear sun protection  Keep spot on face clean- antibiotic ointment is ok

## 2011-07-26 NOTE — Assessment & Plan Note (Signed)
Comedone was expressed in sterile fashion with relief  Aftercare disc Will call if any s/s of infection

## 2011-07-26 NOTE — Assessment & Plan Note (Signed)
Has been stable Last a1c not quite to goal Labs today  On metformin and good diet

## 2011-07-26 NOTE — Assessment & Plan Note (Signed)
I think this is causing arm itching  Disc imp of sun protection  Will return for cryo tx of these areas

## 2011-07-26 NOTE — Assessment & Plan Note (Signed)
Lab today  Continue to watch numbers and also for bruising or bleeding Pt has hx of cerebral hemorrhage

## 2011-07-26 NOTE — Addendum Note (Signed)
Addended by: Eliezer Bottom on: 07/26/2011 04:39 PM   Modules accepted: Orders

## 2011-07-26 NOTE — Assessment & Plan Note (Signed)
Lipids have been well controlled with crestor and diet  Rev low sat fat diet Lab today and update

## 2011-07-30 ENCOUNTER — Other Ambulatory Visit: Payer: Self-pay | Admitting: Family Medicine

## 2011-07-30 MED ORDER — METFORMIN HCL 1000 MG PO TABS: 1000.0000 mg | ORAL_TABLET | Freq: Two times a day (BID) | ORAL | Status: DC

## 2011-07-30 NOTE — Telephone Encounter (Signed)
Needs r/x for metformin [facility]

## 2011-07-30 NOTE — Telephone Encounter (Signed)
Rx refilled.

## 2011-08-28 ENCOUNTER — Encounter: Payer: Self-pay | Admitting: Family Medicine

## 2011-08-28 ENCOUNTER — Ambulatory Visit (INDEPENDENT_AMBULATORY_CARE_PROVIDER_SITE_OTHER): Payer: Medicare Other | Admitting: Family Medicine

## 2011-08-28 VITALS — BP 132/83 | HR 83 | Temp 97.4°F | Ht 68.0 in | Wt 168.5 lb

## 2011-08-28 DIAGNOSIS — L570 Actinic keratosis: Secondary | ICD-10-CM

## 2011-08-28 NOTE — Progress Notes (Signed)
Subjective:    Patient ID: Tony Camacho, male    DOB: 10/08/1924, 78 y.o.   MRN: [id number]  HPI Here for tx of actinic keratoses of his arms  Rough/ scaley areas that do not hurt  occ itch Has had a lot of sun exposure in the past   He also sees Dr Terri Piedra who has treated lesions on his head  He tolerates it  Well  Patient Active Problem List  Diagnosis  . DIABETES MELLITUS, TYPE II  . HYPERCHOLESTEROLEMIA, PURE  . ANEMIA NOS  . THROMBOCYTOPENIA  . HYPERTENSION  . ATRIAL FIBRILLATION  . CEREBRAL HEMORRHAGE  . 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 OSTEOARTHRITIS  . Right knee pain  . Fungal nail infection  . Throat pain  . Seizure  . Comedone  . Actinic keratosis   Past Medical History  Diagnosis Date  . Diabetes mellitus     type II  . Atrial fibrillation     stopped coumadin due to cerebral hem.  . Hypertension     intolerance to ace  . Anemia   . Arthritis     osteoarthritis  . Cataract   . Macular degeneration   . Disc degeneration, lumbar   . Cerebral hemorrhage 2011    amyloid angiopathy with cerebral hemorrhage  . CAP (community acquired pneumonia) 11/2010    RUL  . Seizure disorder 12/12    with hx of hem cva in past    Past Surgical History  Procedure Date  . Left ear surgery   . Hernia repair     as a child  . Colon surgery     bowel obstruction surgery  . Appendectomy   . Tonsillectomy   . Coronary angioplasty     CAD//2D ECHO - mild LVF and dilated EF 35-45%(12/2003)//cardiolite abn (07/2005)  . Carotid screen 10/2002    mild stenosis, neg AAA screen, ABI's normal  . Colonoscopy 12/2005    polyp, tics  . Ct brain 10/2007    small subdural hygromas-- no acute change  . 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 Pacemaker insertion    History  Substance Use Topics  . Smoking status: Former Games developer  . Smokeless tobacco: Not on file  . Alcohol Use: Yes     Rarely   Family History  Problem  Relation Age of Onset  . Diabetes Mother   . Cancer Mother     stomach  . Diabetes Father   . Diabetes Sister   . Diabetes Brother    Allergies  Allergen Reactions  . Lisinopril     REACTION: hypotension  . Penicillins     REACTION: ?  . Rofecoxib     REACTION: edema  . Rosiglitazone Maleate     REACTION: swelling  . Vancomycin     ?? Question reaction to this (said vanemycin in file at home)   Current Outpatient Prescriptions on File Prior to Visit  Medication Sig Dispense Refill  . aspirin 81 MG tablet Take 81 mg by mouth daily.        Marland Kitchen atenolol (TENORMIN) 50 MG tablet TAKE 1/2 TABLET BY MOUTH ONCE DAILY  45 tablet  2  . beta carotene w/minerals (OCUVITE) tablet Take 1 tablet by mouth daily.        . Calcium 500 MG tablet Take 600 mg by mouth daily.        . carbonyl iron ([facility] IRON) 45 MG TABS  Take 45 mg by mouth daily.        . cholecalciferol (VITAMIN D) 1000 UNITS tablet Take two tablets by mouth daily       . Glucosamine-Chondroit-Vit C-Mn (GLUCOSAMINE 1500 COMPLEX PO) 2 every day by mouth       . l-methylfolate-b2-b6-b12 (CEREFOLIN) 07-06-48-5 MG TABS Take 1 tablet by mouth daily.      . metFORMIN (GLUCOPHAGE) 1000 MG tablet Take 1 tablet (1,000 mg total) by mouth 2 (two) times daily with a meal.  60 tablet  3  . Methylfol-Methylcob-Acetylcyst (CEREFOLIN NAC) 6-2-600 MG TABS Take 1 tablet by mouth once a day       . Multiple Vitamin (MULTIVITAMIN PO) Take 1 daily       . Omega-3 Fatty Acids (FISH OIL) 1000 MG CAPS Take 1 capsule by mouth 2 (two) times daily.       . phenytoin (DILANTIN) 100 MG ER capsule Take 1 capsule (100 mg total) by mouth 2 (two) times daily.  60 capsule  1  . rosuvastatin (CRESTOR) 10 MG tablet Take 1 tablet (10 mg total) by mouth daily.  30 tablet  11  . Tamsulosin HCl (FLOMAX) 0.4 MG CAPS TAKE 1 CAPSULE BY MOUTH EVERY DAY  90 capsule  0  . traMADol (ULTRAM) 50 MG tablet 50 mg every 6 (six) hours as needed.       . vitamin B-12 (CYANOCOBALAMIN) 1000  MCG tablet Take 1,000 mcg by mouth daily.        . meloxicam (MOBIC) 7.5 MG tablet Take 1 tablet (7.5 mg total) by mouth daily.  30 tablet  3        Review of Systems Review of Systems  Constitutional: Negative for fever, appetite change, fatigue and unexpected weight change.  Eyes: Negative for pain and visual disturbance.  Respiratory: Negative for cough and shortness of breath.   Cardiovascular: Negative for cp or palpitations    Gastrointestinal: Negative for nausea, diarrhea and constipation.  Genitourinary: Negative for urgency and frequency.  Skin: Negative for pallor or rash  pos for occ itchy dry spots on arms  Neurological: Negative for weakness, light-headedness, numbness and headaches.  Hematological: Negative for adenopathy. Does not bruise/bleed easily.  Psychiatric/Behavioral: Negative for dysphoric mood. The patient is not nervous/anxious.         Objective:   Physical Exam  Constitutional: He appears well-developed and well-nourished. No distress.  HENT:  Head: Normocephalic and atraumatic.  Eyes: Conjunctivae and EOM are normal. Pupils are equal, round, and reactive to light.  Neck: Neck supple.  Lymphadenopathy:    He has no cervical adenopathy.  Skin: Skin is warm and dry. No rash noted. There is erythema. No pallor.       AKS 2-4 mm multiple on forearms 2 on upper forehead One 3 mm brown nevus on L medial forearm  Psychiatric: He has a normal mood and affect.          Assessment & Plan:

## 2011-08-28 NOTE — Patient Instructions (Addendum)
I treated actinic keratoses on arms and head today  Keep wearing sun protection  Keep areas clean Let me know if any problems  Have dermatologist keep an eye on mole on your left arm also

## 2011-08-29 ENCOUNTER — Other Ambulatory Visit: Payer: Self-pay | Admitting: Family Medicine

## 2011-08-30 NOTE — Assessment & Plan Note (Signed)
Multiple on bilateral forearms and 2 on high part of forehead These were treated with liquid nitrogen cryotherapy times one and pt tolerated it well  Disc need to tx these lesions as they can progress to squamous cell carcinoma Disc imp of sun protection and regular derm visits as well  Adv to call if any problems with healing after the procedure

## 2011-10-08 IMAGING — CR DG KNEE 1-2V*R*
2 series · 2 of 2 positions shown · non-contrast
Comparison: Standing AP images of both knees of same date.

CLINICAL DATA: History of acute right knee pain.

RIGHT KNEE - 1-2 VIEW

[view not recorded (1 of 2)]
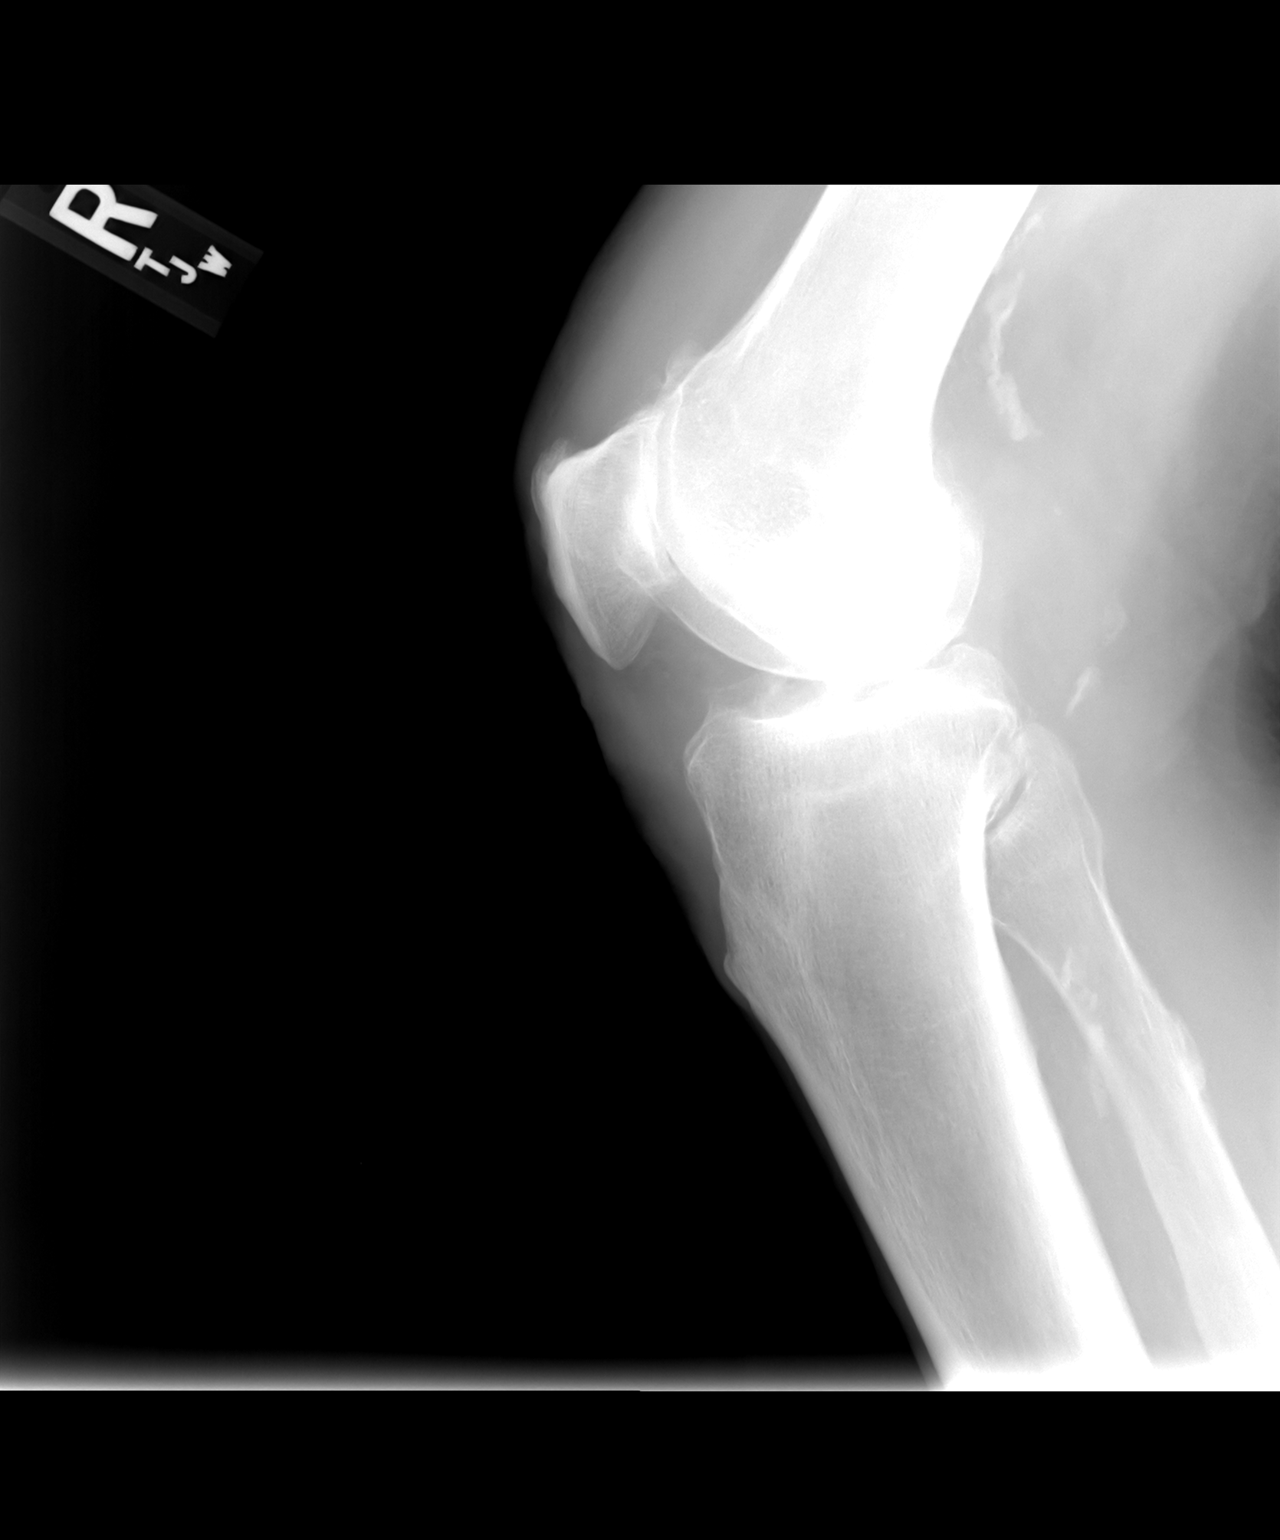

[view not recorded (2 of 2)]
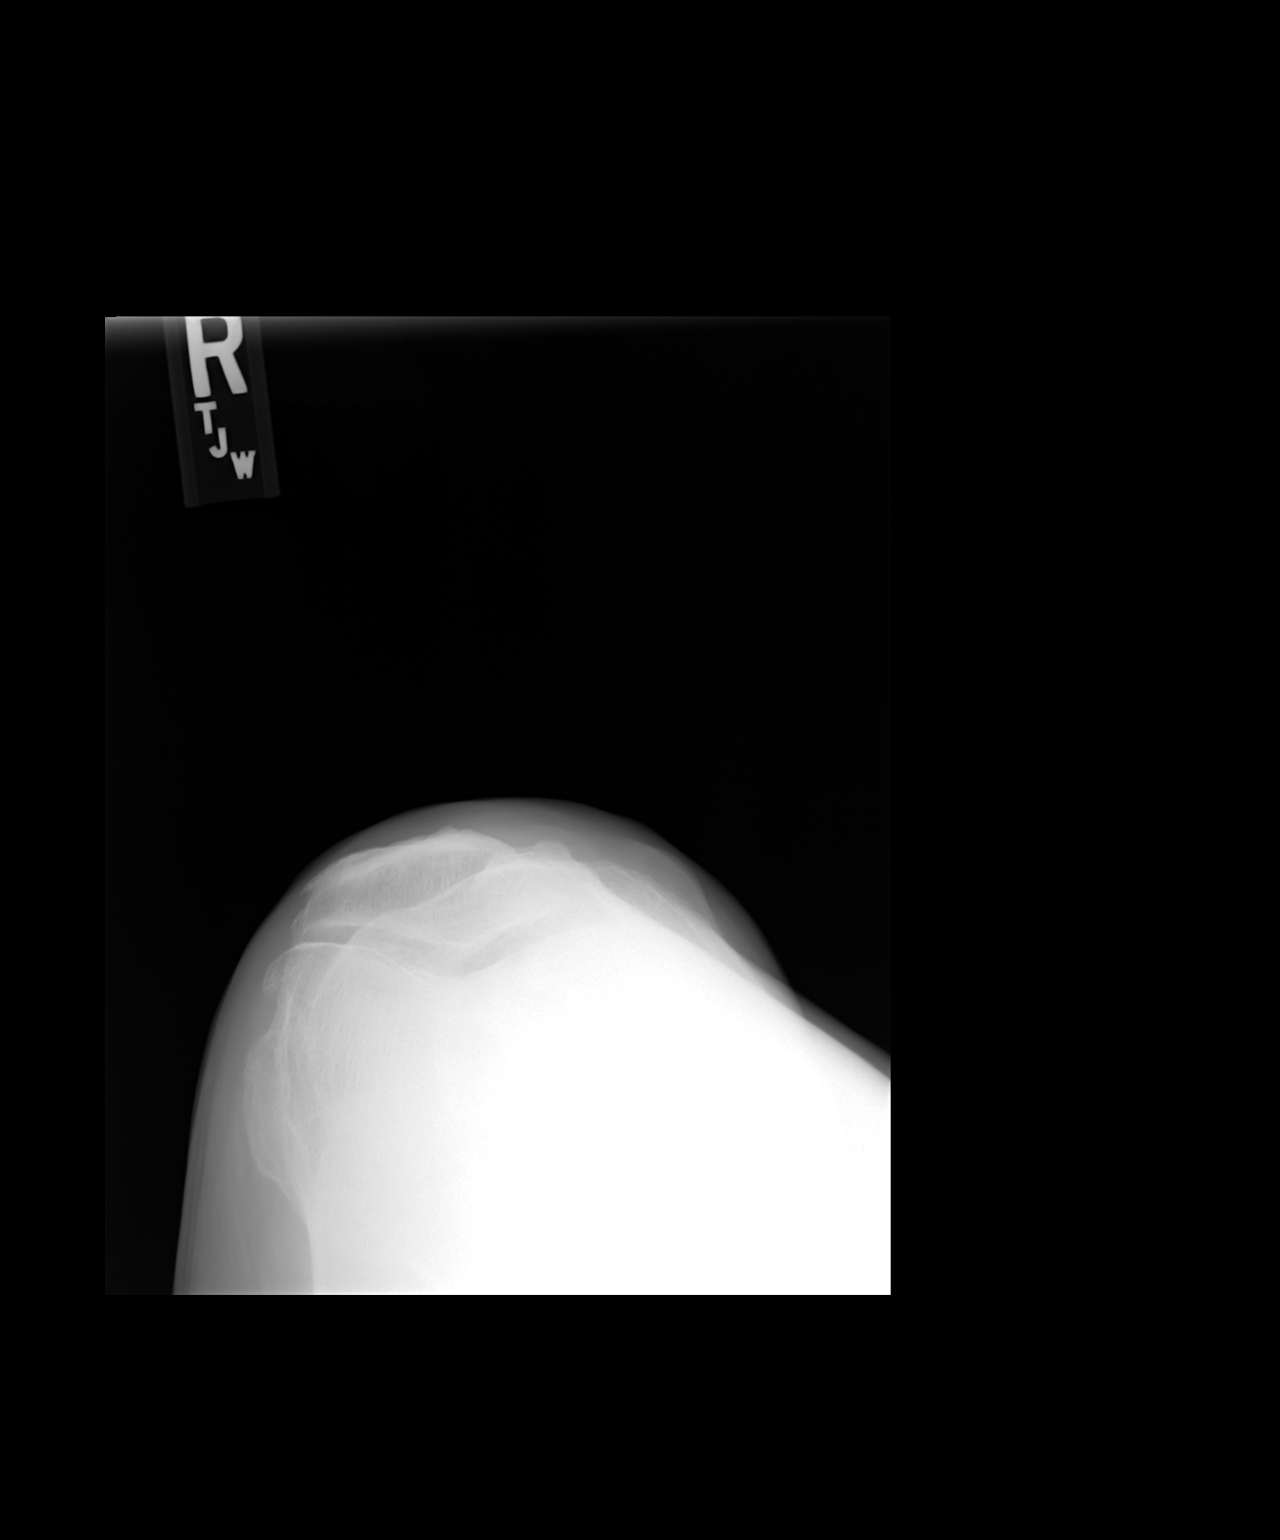

[2 of 2 positions shown; findings below may reference images not displayed]

FINDINGS: On the standing AP image of the right knee there is
slight narrowing of the medial joint space.  On the current lateral
study there is fullness in suprapatellar region which may reflect
an element joint effusion.  There is narrowing of the
patellofemoral joint space with marginal osteophyte formation.  No
fracture or bony destruction is seen.

Nonaneurysmal arterial calcifications are seen.

On the lateral image of the knees the contour of the arterial
calcification of the popliteal artery suggest possible mass density
anterior to it in the posterior aspect of the knee popliteal
region.  The patient could have Trajkoski Baturan synovial popliteal cyst,
mass, or large joint effusion displacing the artery posteriorly
where the artery cages be tortuous but the contour was suggest
possible popliteal mass.
IMPRESSION: Osteoarthritic changes involving the patellofemoral joint.
Osteopenic appearance of bones. Probable joint effusion in the
suprapatellar region.

On the lateral image of the knees the contour of the arterial
calcification of the popliteal artery suggest possible mass density
anterior to it in the posterior aspect of the knee popliteal
region.  The patient could have Trajkoski Baturan synovial popliteal cyst,
mass, or large joint effusion displacing the artery posteriorly but
I cannot exclude other possible mass. Please correlate with
physical examination.  Further imaging could be obtained by
ultrasound or MRI.

## 2011-10-08 IMAGING — CR DG KNEE STANDING AP BILAT
1 series · 1 of 1 positions shown · non-contrast
Comparison: Lateral and patellar images of same date of right knee.

CLINICAL DATA: Acute right knee pain.

BILATERAL KNEES STANDING - 1 VIEW

[view not recorded]
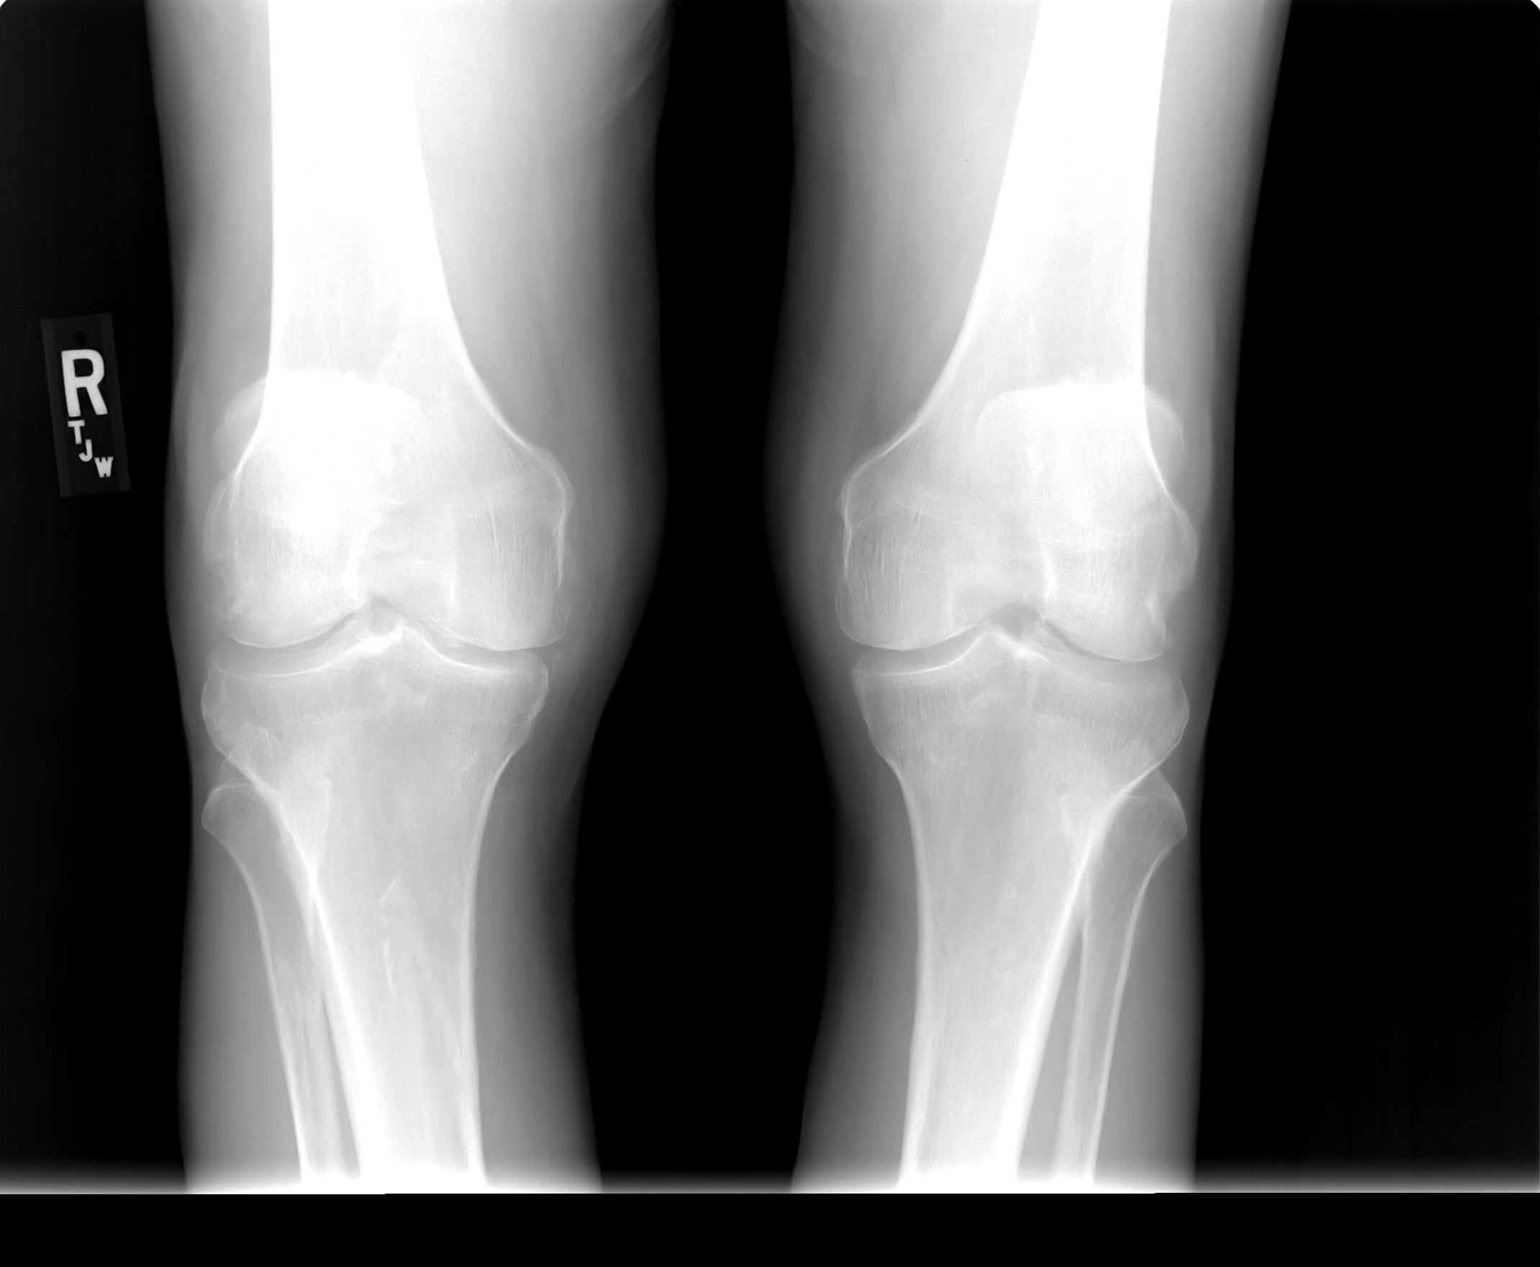

[1 of 1 positions shown; findings below may reference images not displayed]

FINDINGS: On the standing image of the right knee there is slight
narrowing of the medial joint space. On the standing image of the
left knee joint spaces appear preserved.  Nonaneurysmal arterial
calcifications are seen bilaterally.  There is osteopenic
appearance of the bones bilaterally.  No fracture or bony
destruction is seen in either knee.  No chondrocalcinosis is
evident in either knee.
IMPRESSION: Nonaneurysmal arterial calcifications.  Slight narrowing of medial
joint space of the right knee.

## 2011-10-09 ENCOUNTER — Encounter: Payer: Self-pay | Admitting: Family Medicine

## 2011-10-09 ENCOUNTER — Ambulatory Visit (INDEPENDENT_AMBULATORY_CARE_PROVIDER_SITE_OTHER): Payer: Medicare Other | Admitting: Family Medicine

## 2011-10-09 VITALS — BP 130/60 | HR 76 | Temp 97.8°F | Resp 14 | Wt 166.5 lb

## 2011-10-09 DIAGNOSIS — M62838 Other muscle spasm: Secondary | ICD-10-CM

## 2011-10-09 MED ORDER — CYCLOBENZAPRINE HCL 10 MG PO TABS: 10.0000 mg | ORAL_TABLET | Freq: Three times a day (TID) | ORAL | Status: AC | PRN

## 2011-10-09 NOTE — Patient Instructions (Addendum)
Use heat when you can for 10 minutes at a time on neck  We will do PT ref at check out  Try the flexeril with caution  Update if not starting to improve in 1-2  or if worsening

## 2011-10-09 NOTE — Assessment & Plan Note (Signed)
Causing neck pain 4 d, no neurol s/s and reassuring exam  Ref to PT Heat 10 min on and off Flexeril as tolerated Consider xray if not imp

## 2011-10-09 NOTE — Progress Notes (Signed)
Subjective:    Patient ID: Tony Camacho, male    DOB: 03/10/1924, 78 y.o.   MRN: [id number]  HPI Here with R sided neck pain for 4 days  Is getting worse  No injuries known , but had done crossword puzzle on arm of a chair for a while   No radiation of pain to arms No numbness or weakness A lot of pain after inactivity  Never done this before   Took some acetaminophen  ? If on meloxicam right now or not  Has been taking some tramadol -not very effective   Patient Active Problem List  Diagnosis  . DIABETES MELLITUS, TYPE II  . HYPERCHOLESTEROLEMIA, PURE  . ANEMIA NOS  . THROMBOCYTOPENIA  . HYPERTENSION  . ATRIAL FIBRILLATION  . CEREBRAL HEMORRHAGE  . 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 OSTEOARTHRITIS  . Right knee pain  . Fungal nail infection  . Throat pain  . Seizure  . Comedone  . Actinic keratosis   Past Medical History  Diagnosis Date  . Diabetes mellitus     type II  . Atrial fibrillation     stopped coumadin due to cerebral hem.  . Hypertension     intolerance to ace  . Anemia   . Arthritis     osteoarthritis  . Cataract   . Macular degeneration   . Disc degeneration, lumbar   . Cerebral hemorrhage 2011    amyloid angiopathy with cerebral hemorrhage  . CAP (community acquired pneumonia) 11/2010    RUL  . Seizure disorder 12/12    with hx of hem cva in past    Past Surgical History  Procedure Date  . Left ear surgery   . Hernia repair     as a child  . Colon surgery     bowel obstruction surgery  . Appendectomy   . Tonsillectomy   . Coronary angioplasty     CAD//2D ECHO - mild LVF and dilated EF 35-45%(12/2003)//cardiolite abn (07/2005)  . Carotid screen 10/2002    mild stenosis, neg AAA screen, ABI's normal  . Colonoscopy 12/2005    polyp, tics  . Ct brain 10/2007    small subdural hygromas-- no acute change  . 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 Pacemaker insertion    History    Substance Use Topics  . Smoking status: Former Smoker    Quit date: 10/08/1981  . Smokeless tobacco: Never Used  . Alcohol Use: Yes     Rarely   Family History  Problem Relation Age of Onset  . Diabetes Mother   . Cancer Mother     stomach  . Diabetes Father   . Diabetes Sister   . Diabetes Brother    Allergies  Allergen Reactions  . Lisinopril     REACTION: hypotension  . Penicillins     REACTION: ?  . Rofecoxib     REACTION: edema  . Rosiglitazone Maleate     REACTION: swelling  . Vancomycin     ?? Question reaction to this (said vanemycin in file at home)   Current Outpatient Prescriptions on File Prior to Visit  Medication Sig Dispense Refill  . aspirin 81 MG tablet Take 81 mg by mouth daily.        Marland Kitchen atenolol (TENORMIN) 50 MG tablet TAKE 1/2 TABLET BY MOUTH ONCE DAILY  45 tablet  2  . beta carotene w/minerals (OCUVITE) tablet Take 1 tablet by mouth  daily.        . Calcium 500 MG tablet Take 600 mg by mouth daily.        . carbonyl iron ([facility] IRON) 45 MG TABS Take 45 mg by mouth daily.        . cholecalciferol (VITAMIN D) 1000 UNITS tablet Take two tablets by mouth daily       . Glucosamine-Chondroit-Vit C-Mn (GLUCOSAMINE 1500 COMPLEX PO) 2 every day by mouth       . l-methylfolate-b2-b6-b12 (CEREFOLIN) 07-06-48-5 MG TABS Take 1 tablet by mouth daily.      . meloxicam (MOBIC) 7.5 MG tablet Take 1 tablet (7.5 mg total) by mouth daily.  30 tablet  3  . metFORMIN (GLUCOPHAGE) 1000 MG tablet Take 1 tablet (1,000 mg total) by mouth 2 (two) times daily with a meal.  60 tablet  3  . Methylfol-Methylcob-Acetylcyst (CEREFOLIN NAC) 6-2-600 MG TABS Take 1 tablet by mouth once a day       . Multiple Vitamin (MULTIVITAMIN PO) Take 1 daily       . Omega-3 Fatty Acids (FISH OIL) 1000 MG CAPS Take 1 capsule by mouth 2 (two) times daily.       . phenytoin (DILANTIN) 100 MG ER capsule Take 1 capsule (100 mg total) by mouth 2 (two) times daily.  60 capsule  1  . rosuvastatin (CRESTOR) 10  MG tablet Take 1 tablet (10 mg total) by mouth daily.  30 tablet  11  . Tamsulosin HCl (FLOMAX) 0.4 MG CAPS TAKE 1 CAPSULE BY MOUTH EVERY DAY  90 capsule  0  . traMADol (ULTRAM) 50 MG tablet 50 mg every 6 (six) hours as needed.       . vitamin B-12 (CYANOCOBALAMIN) 1000 MCG tablet Take 1,000 mcg by mouth daily.           Review of Systems Review of Systems  Constitutional: Negative for fever, appetite change, fatigue and unexpected weight change.  Eyes: Negative for pain and visual disturbance.  Respiratory: Negative for cough and shortness of breath.   Cardiovascular: Negative for cp or palpitations    Gastrointestinal: Negative for nausea, diarrhea and constipation.  Genitourinary: Negative for urgency and frequency.  Skin: Negative for pallor or rash   MSK pos for neck pain and neg for joint swelling  Neurological: Negative for weakness, light-headedness, numbness and headaches.  Hematological: Negative for adenopathy. Does not bruise/bleed easily.  Psychiatric/Behavioral: Negative for dysphoric mood. The patient is not nervous/anxious.         Objective:   Physical Exam  Constitutional: He appears well-developed and well-nourished. No distress.  HENT:  Head: Normocephalic and atraumatic.  Mouth/Throat: Oropharynx is clear and moist.  Eyes: Conjunctivae and EOM are normal. Pupils are equal, round, and reactive to light. No scleral icterus.  Neck: Normal range of motion. Neck supple. No JVD present. No thyromegaly present.  Cardiovascular: Normal rate and regular rhythm.   Pulmonary/Chest: Effort normal and breath sounds normal. No respiratory distress. He has no wheezes.  Abdominal: Soft. Bowel sounds are normal.  Musculoskeletal: He exhibits no edema.       Cervical back: He exhibits decreased range of motion, tenderness and spasm. He exhibits no bony tenderness, no swelling, no edema and no deformity.       Tender R paracervical musculature and trapezius area  Pain to fully  flex and rot R  No crepitus or skin change  Lymphadenopathy:    He has no cervical adenopathy.  Neurological: He  is alert. He has normal strength and normal reflexes. No cranial nerve deficit or sensory deficit. He exhibits normal muscle tone. Coordination normal.  Skin: Skin is warm and dry. No rash noted. No erythema.  Psychiatric: He has a normal mood and affect.          Assessment & Plan:

## 2011-10-17 ENCOUNTER — Other Ambulatory Visit: Payer: Self-pay | Admitting: Family Medicine

## 2011-10-17 NOTE — Telephone Encounter (Signed)
Request for Tamsulosin HCI .04 mg.#90. Ok to refill?

## 2011-10-17 NOTE — Telephone Encounter (Signed)
Ok with refils for 12 months- thanks

## 2011-10-18 MED ORDER — TAMSULOSIN HCL 0.4 MG PO CAPS: 0.4000 mg | ORAL_CAPSULE | Freq: Every day | ORAL | Status: AC

## 2011-10-18 NOTE — Addendum Note (Signed)
Addended by: Josph Macho A on: 10/18/2011 09:39 AM   Modules accepted: Orders

## 2011-11-19 LAB — HM DIABETES EYE EXAM

## 2011-11-20 ENCOUNTER — Other Ambulatory Visit: Payer: Self-pay | Admitting: Family Medicine

## 2011-12-17 ENCOUNTER — Encounter: Payer: Self-pay | Admitting: Family Medicine

## 2012-02-02 ENCOUNTER — Other Ambulatory Visit: Payer: Self-pay | Admitting: Family Medicine

## 2012-03-14 ENCOUNTER — Other Ambulatory Visit: Payer: Self-pay | Admitting: *Deleted

## 2012-03-14 MED ORDER — METFORMIN HCL 1000 MG PO TABS: 1000.0000 mg | ORAL_TABLET | Freq: Two times a day (BID) | ORAL | Status: DC

## 2012-03-25 ENCOUNTER — Other Ambulatory Visit: Payer: Self-pay | Admitting: Family Medicine

## 2012-03-31 ENCOUNTER — Encounter: Payer: Self-pay | Admitting: Cardiology

## 2012-03-31 NOTE — Progress Notes (Signed)
Patient ID: Tony Camacho, male   DOB: May 24, 1924, 79 y.o.   MRN: [id number]   Tony Camacho    Date of visit:  03/31/2012 DOB:  August 25, 1924    Age:  79 yrs. Medical record number:  [id number]     Account number:  [id number] Primary Care Provider: Roxy Camacho ____________________________ CURRENT DIAGNOSES  1. Personal history of TIA or stroke without residua  2. CAD,Native  3. Arrhythmia-Atrial Fibrillation  4. Pacemaker/cardiac insitu  5. Hyperlipidemia  6. Male Erectile Disorder  7. Diabetes Mellitus-NIDD ____________________________ ALLERGIES  penicillin V potassium  warfarin, Cerebral hemorrhage ____________________________ MEDICATIONS  1. Ocuvite 150-30-6-150 mg-unit-mg-mg capsule, 1 p.o. daily  2. ferrous sulfate 325 mg (65 mg iron) Tablet, 1 p.o. daily  3. Calcium 500 500 mg calcium (1,250 mg) Tablet, 1 p.o. daily  4. Vitamin B-12 250 mcg Tablet, 1 p.o. daily  5. Vitamin D 2,000 unit Capsule, 1 p.o. daily  6. Cerefolin 07-10-48-1 mg Tablet, 1 p.o. daily  7. Fish Oil 1,000 mg Capsule, BID  8. aspirin 81 mg tablet, chewable, 1 p.o. daily  9. atenolol 50 mg tablet, 1/2 tab daily  10. Glucosamine 500 mg tablet, BID  11. multivitamin tablet, 1 p.o. daily  12. Crestor 10 mg tablet, 1 p.o. daily  13. tamsulosin 0.4 mg capsule,extended release 24hr, 1 p.o. daily  14. metformin 1,000 mg tablet, BID  15. phenytoin sodium extended 100 mg capsule, 3 qd  16. tramadol 50 mg tablet, PRN  17. cyclobenzaprine 10 mg tablet, PRN ____________________________ CHIEF COMPLAINTS  Followup of Arrhythmia-Atrial Fibrillation  Followup of CAD,Native ____________________________ HISTORY OF PRESENT ILLNESS  Patient seen for cardiac followup. He has been feeling well since he was here. He continues to be active and will be 79 on his next birthday. He denies angina and has no PND, orthopnea or edema. He doesn't have any significant claudication. He had a previous cerebral bleed on warfarin and this has been  stopped. Pacemaker function has looked good. ____________________________ PAST HISTORY  Past Medical Illnesses:  cerebral bleed on warfarin, DM-non-insulin dependent, hyperlipidemia, history of gastric ulcer with bleeding;  Cardiovascular Illnesses:  atrial fibrillation-chronic, CAD;  Surgical Procedures:  appendectomy, inguinal herniorrhaphy-rt, tonsillectomy, bowel obst;  Cardiology Procedures-Invasive:  Medtronic pacemaker implant March 2001, cardiac cath (left) June 2007, Medtronic generator change October 2009;  Cardiology Procedures-Noninvasive:  echocardiogram, adenosine cardiolite, echocardiogram July 2011, echocardiogram February 2012;  Cardiac Cath Results:  calcified Left main, 40% stenosis Left main, 40% stenosis proximal LAD, 40% stenosis CFX, 40% stenosis RCA;  LVEF of 30% documented via echocardiogram on 07/11/2011CHADS Score:  4  CHA2DS2-VASC Score:  5 ____________________________ CARDIO-PULMONARY TEST DATES EKG Date:  03/31/2012;   Cardiac Cath Date:  07/26/2005;  Nuclear Study Date:  07/12/2005;  Echocardiography Date: 03/22/2010;  Chest Xray Date: 01/29/2011;   ____________________________ SOCIAL HISTORY Alcohol Use:  socially;  Smoking:  used to smoke but quit Prior to 1980;  Diet:  regular diet;  Lifestyle:  widower, remarried, 3 daughters and 1 stepson;  Exercise:  exercises daily, aerobics, mowing the yard and walking;  Occupation:  retired and Engineer, production;  Residence:  lives with wife;   ____________________________ REVIEW OF SYSTEMS General:  denies recent weight change, fatique or change in exercise tolerance.Eyes:  wears eye glasses/contact lenses, cataracts  Ears, Nose, Throat, Mouth:  partial hearing loss Respiratory:  denies dyspnea, cough, wheezing or hemoptysis.  Cardiovascular:  please review HPI  Genitourinary-Male:  hesitancy, nocturia, erectile dysfunction  Musculoskeletal:  arthritis of the neck, arthritis of  the hips and knees Endocrine:   diabetes ____________________________ PHYSICAL EXAMINATION VITAL SIGNS  Blood Pressure:  110/64 Sitting, Right arm, regular cuff  , 114/70 Standing, Right arm and regular cuff   Pulse:  74/min. Weight:  163.00 lbs. Height:  69"BMI: 24  Constitutional:  pleasant white male in no acute distress Skin:  warm and dry to touch, no apparent skin lesions, or masses noted. Head:  normocephalic, balding male hair pattern ENT:  ears, nose and throat reveal no gross abnormalities.  Dentition good. Neck:  supple, no masses, thyromegaly, JVD. Carotid pulses are full and equal bilaterally without bruits. Chest:  normal symmetry, clear to auscultation and percussion., healed pacemaker incision in the right pectoral area Cardiac:  regular rhythm, normal S1 and S2, No S3 or S4, no murmurs, gallops or rubs detected. Peripheral Pulses:  femoral pulses 2+, dorsalis pedis pulses 1+, posterior tibial pulses 1+ Extremities & Back:  trace edema, mild bilateral venous insufficiency changes present Neurological:  no gross motor or sensory deficits noted, affect appropriate, oriented x3. ____________________________ MOST RECENT LIPID PANEL 07/11/10  CHOL TOTL 95 mg/dl, LDL 39 calc, HDL 44 mg/dl, TRIGLYCER 57 mg/dl and CHOL/HDL 2 (Calc) ____________________________ IMPRESSIONS/PLAN  1. Functioning permanent pacemaker 2. Chronic atrial fibrillation 3. Previous stroke due to hemorrhage on warfarin 4. Coronary artery disease treated medically  Recommendations:  Return visit in 6 months. Call if recurrent problems. EKG shows paced rhythm and underlying atrial fibrillation. ____________________________ Christianne Dolin  1. 12 Lead EKG: Today                       ____________________________ Cardiology Physician:  Darden Palmer MD [facility]

## 2012-04-10 ENCOUNTER — Other Ambulatory Visit: Payer: Self-pay | Admitting: Neurology

## 2012-04-10 DIAGNOSIS — I619 Nontraumatic intracerebral hemorrhage, unspecified: Secondary | ICD-10-CM

## 2012-04-22 ENCOUNTER — Ambulatory Visit (INDEPENDENT_AMBULATORY_CARE_PROVIDER_SITE_OTHER): Payer: Medicare Other

## 2012-04-22 DIAGNOSIS — R0989 Other specified symptoms and signs involving the circulatory and respiratory systems: Secondary | ICD-10-CM

## 2012-04-30 ENCOUNTER — Telehealth: Payer: Self-pay | Admitting: *Deleted

## 2012-04-30 NOTE — Telephone Encounter (Signed)
Patient wife called to get her husband doppler results .

## 2012-05-01 ENCOUNTER — Telehealth: Payer: Self-pay | Admitting: Neurology

## 2012-05-01 NOTE — Telephone Encounter (Signed)
Calling for doppler results. °

## 2012-05-06 ENCOUNTER — Other Ambulatory Visit: Payer: Self-pay | Admitting: Neurology

## 2012-05-09 ENCOUNTER — Other Ambulatory Visit: Payer: Self-pay | Admitting: Family Medicine

## 2012-05-09 NOTE — Telephone Encounter (Signed)
No recent appt or no future appt. Is it okay to refill?

## 2012-05-09 NOTE — Telephone Encounter (Signed)
Please refil for 6 months

## 2012-05-09 NOTE — Telephone Encounter (Signed)
done

## 2012-05-12 ENCOUNTER — Telehealth: Payer: Self-pay | Admitting: *Deleted

## 2012-05-12 NOTE — Telephone Encounter (Signed)
Called patient and gave his wife the results to his doppler she showed understanding.

## 2012-07-04 ENCOUNTER — Other Ambulatory Visit: Payer: Self-pay | Admitting: Family Medicine

## 2012-07-19 ENCOUNTER — Other Ambulatory Visit: Payer: Self-pay | Admitting: Family Medicine

## 2012-07-21 ENCOUNTER — Other Ambulatory Visit: Payer: Self-pay | Admitting: Family Medicine

## 2012-07-21 NOTE — Telephone Encounter (Signed)
done

## 2012-07-21 NOTE — Telephone Encounter (Signed)
Please refill for 6 months 

## 2012-07-21 NOTE — Telephone Encounter (Signed)
Electronic refill request, no recent appt/future appt, please advise 

## 2012-09-29 ENCOUNTER — Ambulatory Visit: Payer: Self-pay | Admitting: Nurse Practitioner

## 2012-09-30 ENCOUNTER — Ambulatory Visit: Payer: Self-pay | Admitting: Nurse Practitioner

## 2012-10-08 ENCOUNTER — Other Ambulatory Visit: Payer: Self-pay | Admitting: Neurology

## 2012-12-05 ENCOUNTER — Other Ambulatory Visit: Payer: Self-pay | Admitting: Family Medicine

## 2012-12-05 NOTE — Telephone Encounter (Signed)
Electronic refill request, no recent/future appt., please advise  

## 2012-12-07 NOTE — Telephone Encounter (Signed)
Please schedule PE and refill until then  

## 2012-12-10 NOTE — Telephone Encounter (Signed)
Tried to call pt and phone wouldn't let me connect call, refilled Rx for 1 month and mailed letter letting pt know he is due for a PE

## 2012-12-18 ENCOUNTER — Telehealth: Payer: Self-pay

## 2012-12-18 NOTE — Telephone Encounter (Signed)
Mrs Brosh left v/m that they have moved and wants Dr Milinda Antis to continue refilling pt's meds until can get established with new dr. Did not leave name of med to be refilled. Called # left and no answer and no v/m. Will try again.

## 2012-12-24 NOTE — Telephone Encounter (Signed)
Tried call Tony Camacho's cell # no answer and no v/m. No other contact # available. No address available since pt has moved. Will try call later.

## 2013-01-05 NOTE — Telephone Encounter (Signed)
Spoke with Tony Camacho and pt has appt this week with doctor in [facility] and pt will cb if anything else needed.

## 2013-03-29 ENCOUNTER — Other Ambulatory Visit: Payer: Self-pay | Admitting: Family Medicine

## 2013-03-30 NOTE — Telephone Encounter (Signed)
Please schedule f/u in the spring and refill until then  

## 2013-03-30 NOTE — Telephone Encounter (Signed)
Electronic refill request, no recent/future appt., please advise  

## 2013-04-01 NOTE — Telephone Encounter (Signed)
Please schedule f/u in the spring and refill until then  

## 2013-04-01 NOTE — Telephone Encounter (Signed)
Spoke with Tony NeighboursBarbara Camacho.  She [place] and Mr. Tony BosBrenner has established care with Dr. Nelta NumbersEric Camacho.  [facility] will contact [facility] [place] and get the doctor corrected on his prescriptions so Dr. Milinda Camacho will not keep getting refill request.

## 2013-04-01 NOTE — Telephone Encounter (Signed)
It appears pt has not been seen since 10/2011?

## 2013-04-06 ENCOUNTER — Other Ambulatory Visit: Payer: Self-pay | Admitting: Family Medicine
